# Patient Record
Sex: Male | Born: 1935 | Race: White | Hispanic: No | Smoking: Former smoker
Health system: Southern US, Community
[De-identification: ages and names within clinical notes are randomized; demographics above are authoritative.]

## PROBLEM LIST (undated history)

## (undated) DIAGNOSIS — J449 Chronic obstructive pulmonary disease, unspecified: Secondary | ICD-10-CM

## (undated) DIAGNOSIS — I10 Essential (primary) hypertension: Secondary | ICD-10-CM

## (undated) DIAGNOSIS — F419 Anxiety disorder, unspecified: Secondary | ICD-10-CM

## (undated) DIAGNOSIS — IMO0001 Reserved for inherently not codable concepts without codable children: Secondary | ICD-10-CM

## (undated) DIAGNOSIS — C3490 Malignant neoplasm of unspecified part of unspecified bronchus or lung: Secondary | ICD-10-CM

## (undated) DIAGNOSIS — K219 Gastro-esophageal reflux disease without esophagitis: Secondary | ICD-10-CM

## (undated) DIAGNOSIS — E7800 Pure hypercholesterolemia, unspecified: Secondary | ICD-10-CM

## (undated) HISTORY — DX: Chronic obstructive pulmonary disease, unspecified: J44.9

## (undated) HISTORY — DX: Anxiety disorder, unspecified: F41.9

## (undated) HISTORY — DX: Pure hypercholesterolemia, unspecified: E78.00

## (undated) HISTORY — DX: Gastro-esophageal reflux disease without esophagitis: K21.9

## (undated) HISTORY — DX: Malignant neoplasm of unspecified part of unspecified bronchus or lung: C34.90

## (undated) HISTORY — DX: Reserved for inherently not codable concepts without codable children: IMO0001

## (undated) HISTORY — DX: Essential (primary) hypertension: I10

---

## 1995-10-24 HISTORY — PX: OTHER SURGICAL HISTORY: SHX169

## 1998-05-12 ENCOUNTER — Ambulatory Visit (HOSPITAL_COMMUNITY): Admission: RE | Admit: 1998-05-12 | Discharge: 1998-05-12 | Payer: Self-pay | Admitting: Gastroenterology

## 1998-12-13 ENCOUNTER — Ambulatory Visit (HOSPITAL_COMMUNITY): Admission: RE | Admit: 1998-12-13 | Discharge: 1998-12-13 | Payer: Self-pay | Admitting: Family Medicine

## 1998-12-13 ENCOUNTER — Encounter: Payer: Self-pay | Admitting: Family Medicine

## 1999-10-19 ENCOUNTER — Ambulatory Visit (HOSPITAL_COMMUNITY): Admission: RE | Admit: 1999-10-19 | Discharge: 1999-10-19 | Payer: Self-pay | Admitting: Gastroenterology

## 2000-07-27 ENCOUNTER — Encounter: Payer: Self-pay | Admitting: Gastroenterology

## 2000-07-27 ENCOUNTER — Ambulatory Visit (HOSPITAL_COMMUNITY): Admission: RE | Admit: 2000-07-27 | Discharge: 2000-07-27 | Payer: Self-pay | Admitting: Gastroenterology

## 2000-10-23 HISTORY — PX: OTHER SURGICAL HISTORY: SHX169

## 2000-11-05 ENCOUNTER — Ambulatory Visit (HOSPITAL_COMMUNITY): Admission: RE | Admit: 2000-11-05 | Discharge: 2000-11-05 | Payer: Self-pay | Admitting: Gastroenterology

## 2000-11-05 ENCOUNTER — Encounter: Payer: Self-pay | Admitting: Gastroenterology

## 2001-05-16 ENCOUNTER — Encounter: Admission: RE | Admit: 2001-05-16 | Discharge: 2001-05-16 | Payer: Self-pay | Admitting: Family Medicine

## 2001-05-16 ENCOUNTER — Encounter: Payer: Self-pay | Admitting: Family Medicine

## 2001-08-22 ENCOUNTER — Other Ambulatory Visit: Admission: RE | Admit: 2001-08-22 | Discharge: 2001-08-22 | Payer: Self-pay | Admitting: Family Medicine

## 2001-10-25 ENCOUNTER — Encounter: Admission: RE | Admit: 2001-10-25 | Discharge: 2001-10-25 | Payer: Self-pay | Admitting: Family Medicine

## 2001-10-25 ENCOUNTER — Encounter: Payer: Self-pay | Admitting: Family Medicine

## 2001-10-30 ENCOUNTER — Inpatient Hospital Stay (HOSPITAL_COMMUNITY): Admission: EM | Admit: 2001-10-30 | Discharge: 2001-11-02 | Payer: Self-pay | Admitting: Emergency Medicine

## 2001-10-30 ENCOUNTER — Encounter: Payer: Self-pay | Admitting: Internal Medicine

## 2002-01-29 ENCOUNTER — Ambulatory Visit (HOSPITAL_COMMUNITY): Admission: RE | Admit: 2002-01-29 | Discharge: 2002-01-29 | Payer: Self-pay | Admitting: Pulmonary Disease

## 2002-01-29 ENCOUNTER — Encounter: Payer: Self-pay | Admitting: Pulmonary Disease

## 2002-02-26 ENCOUNTER — Encounter: Payer: Self-pay | Admitting: Pulmonary Disease

## 2002-02-26 ENCOUNTER — Ambulatory Visit (HOSPITAL_COMMUNITY): Admission: RE | Admit: 2002-02-26 | Discharge: 2002-02-26 | Payer: Self-pay | Admitting: Pulmonary Disease

## 2002-03-28 ENCOUNTER — Encounter: Admission: RE | Admit: 2002-03-28 | Discharge: 2002-03-28 | Payer: Self-pay | Admitting: Family Medicine

## 2002-03-28 ENCOUNTER — Encounter: Payer: Self-pay | Admitting: Family Medicine

## 2002-04-12 ENCOUNTER — Encounter: Payer: Self-pay | Admitting: Family Medicine

## 2002-04-12 ENCOUNTER — Encounter: Admission: RE | Admit: 2002-04-12 | Discharge: 2002-04-12 | Payer: Self-pay | Admitting: Family Medicine

## 2002-04-21 ENCOUNTER — Encounter: Admission: RE | Admit: 2002-04-21 | Discharge: 2002-05-26 | Payer: Self-pay | Admitting: Family Medicine

## 2002-05-29 ENCOUNTER — Ambulatory Visit (HOSPITAL_COMMUNITY): Admission: RE | Admit: 2002-05-29 | Discharge: 2002-05-29 | Payer: Self-pay | Admitting: Sports Medicine

## 2002-05-29 ENCOUNTER — Encounter: Payer: Self-pay | Admitting: Sports Medicine

## 2002-06-04 ENCOUNTER — Encounter: Payer: Self-pay | Admitting: Sports Medicine

## 2002-06-04 ENCOUNTER — Encounter: Admission: RE | Admit: 2002-06-04 | Discharge: 2002-06-04 | Payer: Self-pay | Admitting: Sports Medicine

## 2002-06-18 ENCOUNTER — Encounter: Admission: RE | Admit: 2002-06-18 | Discharge: 2002-06-18 | Payer: Self-pay | Admitting: Sports Medicine

## 2002-06-18 ENCOUNTER — Encounter: Payer: Self-pay | Admitting: Sports Medicine

## 2002-07-03 ENCOUNTER — Encounter: Admission: RE | Admit: 2002-07-03 | Discharge: 2002-07-03 | Payer: Self-pay | Admitting: Sports Medicine

## 2002-07-03 ENCOUNTER — Encounter: Payer: Self-pay | Admitting: Sports Medicine

## 2002-08-12 ENCOUNTER — Ambulatory Visit (HOSPITAL_COMMUNITY): Admission: RE | Admit: 2002-08-12 | Discharge: 2002-08-12 | Payer: Self-pay | Admitting: Gastroenterology

## 2002-08-12 ENCOUNTER — Encounter (INDEPENDENT_AMBULATORY_CARE_PROVIDER_SITE_OTHER): Payer: Self-pay | Admitting: Specialist

## 2002-12-22 ENCOUNTER — Encounter: Payer: Self-pay | Admitting: Sports Medicine

## 2002-12-22 ENCOUNTER — Encounter: Admission: RE | Admit: 2002-12-22 | Discharge: 2002-12-22 | Payer: Self-pay | Admitting: Sports Medicine

## 2002-12-25 ENCOUNTER — Encounter: Payer: Self-pay | Admitting: Sports Medicine

## 2002-12-25 ENCOUNTER — Encounter: Admission: RE | Admit: 2002-12-25 | Discharge: 2002-12-25 | Payer: Self-pay | Admitting: Sports Medicine

## 2003-01-08 ENCOUNTER — Encounter: Payer: Self-pay | Admitting: Sports Medicine

## 2003-01-08 ENCOUNTER — Encounter: Admission: RE | Admit: 2003-01-08 | Discharge: 2003-01-08 | Payer: Self-pay | Admitting: Sports Medicine

## 2003-10-29 ENCOUNTER — Encounter: Admission: RE | Admit: 2003-10-29 | Discharge: 2003-10-29 | Payer: Self-pay | Admitting: Sports Medicine

## 2003-11-11 ENCOUNTER — Encounter: Admission: RE | Admit: 2003-11-11 | Discharge: 2003-11-11 | Payer: Self-pay | Admitting: Sports Medicine

## 2004-01-27 ENCOUNTER — Encounter: Admission: RE | Admit: 2004-01-27 | Discharge: 2004-01-27 | Payer: Self-pay | Admitting: Internal Medicine

## 2004-03-08 ENCOUNTER — Encounter: Admission: RE | Admit: 2004-03-08 | Discharge: 2004-03-08 | Payer: Self-pay | Admitting: Internal Medicine

## 2004-08-17 ENCOUNTER — Encounter: Admission: RE | Admit: 2004-08-17 | Discharge: 2004-08-17 | Payer: Self-pay | Admitting: Otolaryngology

## 2004-09-12 ENCOUNTER — Ambulatory Visit: Payer: Self-pay | Admitting: Pulmonary Disease

## 2004-12-14 ENCOUNTER — Ambulatory Visit: Payer: Self-pay | Admitting: Pulmonary Disease

## 2005-01-18 HISTORY — PX: US ECHOCARDIOGRAPHY: HXRAD669

## 2005-03-10 ENCOUNTER — Ambulatory Visit: Payer: Self-pay | Admitting: Critical Care Medicine

## 2005-06-23 HISTORY — PX: CARDIOVASCULAR STRESS TEST: SHX262

## 2005-08-10 ENCOUNTER — Ambulatory Visit: Payer: Self-pay | Admitting: Internal Medicine

## 2005-08-24 ENCOUNTER — Encounter: Admission: RE | Admit: 2005-08-24 | Discharge: 2005-08-24 | Payer: Self-pay | Admitting: Internal Medicine

## 2005-10-21 IMAGING — CT CT CHEST W/ CM
1 series · 16 of 31 positions shown, 20 images · IV contrast (75ML OMNI 300)
Comparison: none

CLINICAL DATA: Weight loss.  History of lung cancer; status-post partial lung resection in 5000.  Evaluate for mass. 
 CT OF THE CHEST WITH CONTRAST 
 Multidetector helical CT imaging was performed during the IV bolus injection of 75 cc Omnipaque 300.  Correlation is made with a prior study done at [REDACTED] on 01/29/02.
 The patient is status-post bilateral thoracotomy with apparent upper lobe resection bilaterally.  There is stable scarring at the right apex, both lung bases, and within the lingula.  No lung mass or endobronchial lesion is demonstrated.  There is no evidence of mediastinal or hilar lymphadenopathy.  There is no pleural or pericardial effusion.  Images through the upper abdomen demonstrate no adrenal mass or change in the low attenuation lesion in the left hepatic lobe.  There is a probable sebaceous cyst posterior to the L-1 transverse process which was present previously. 
 IMPRESSION
 Stable post-operative CT of the chest.  No evidence of local recurrence or metastatic disease.

[Series 2: — · axial · 0.66mm/px · z∈[-274,+26]mm · 16 of 66 slices shown, 20 images]
[im 3/66  mediastinal]
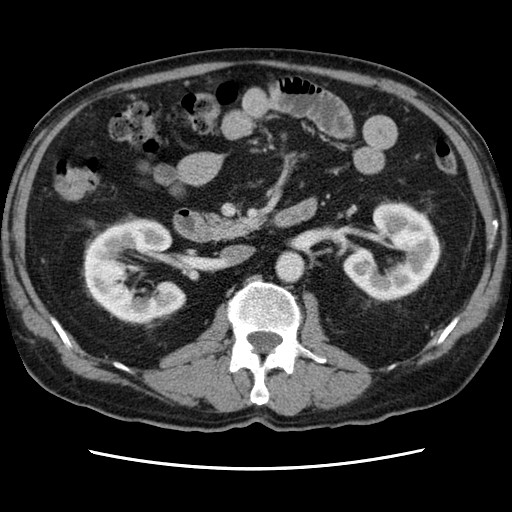
[im 3/66  lung]
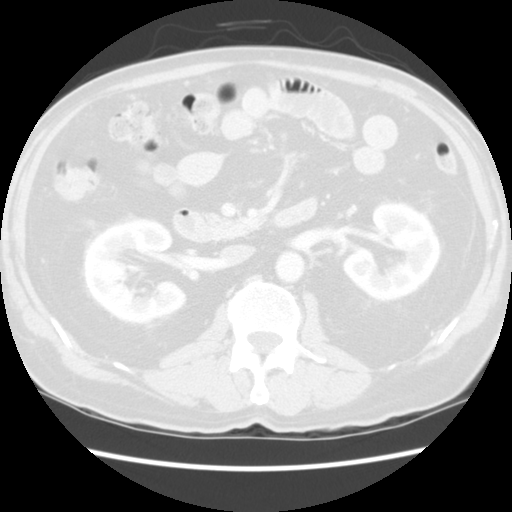
[im 8/66  lung]
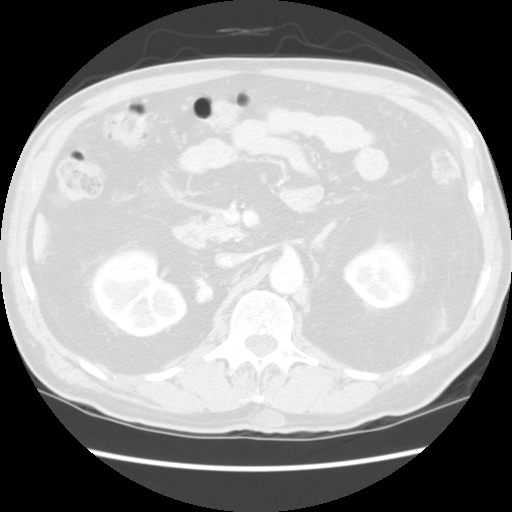
[im 13/66  lung]
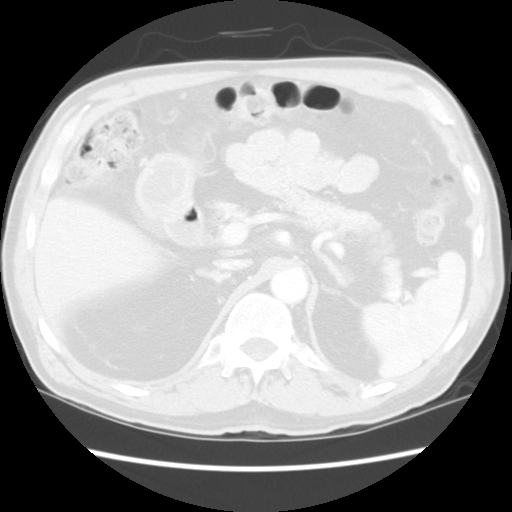
[im 15/66  lung]
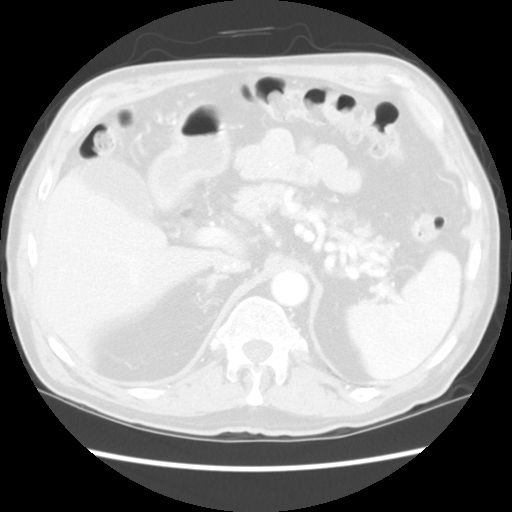
[im 20/66  mediastinal]
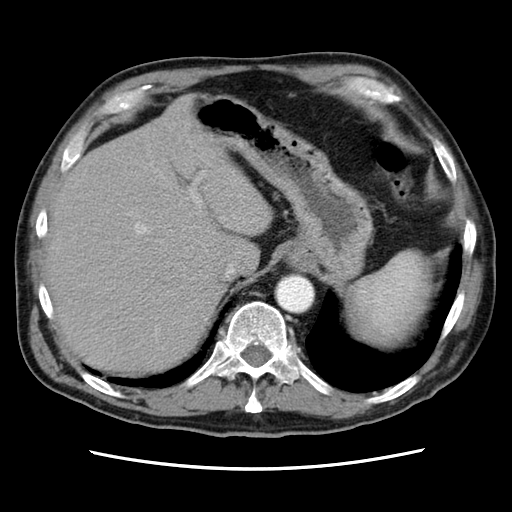
[im 20/66  lung]
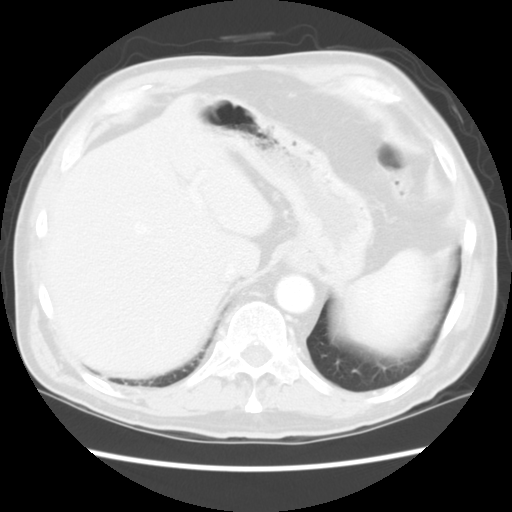
[im 22/66  lung]
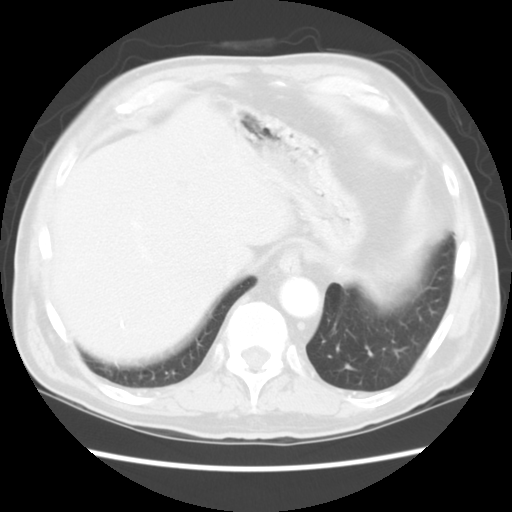
[im 27/66  lung]
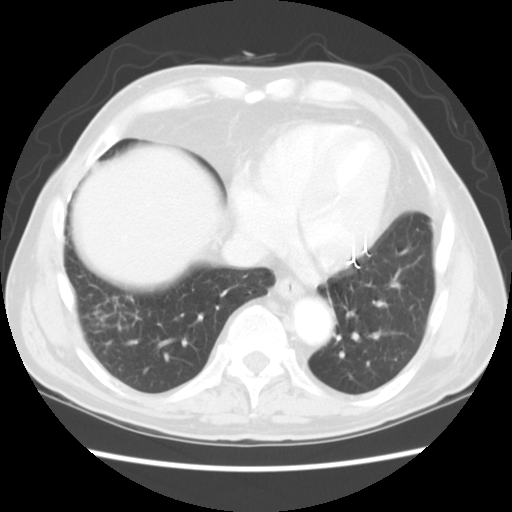
[im 32/66  lung]
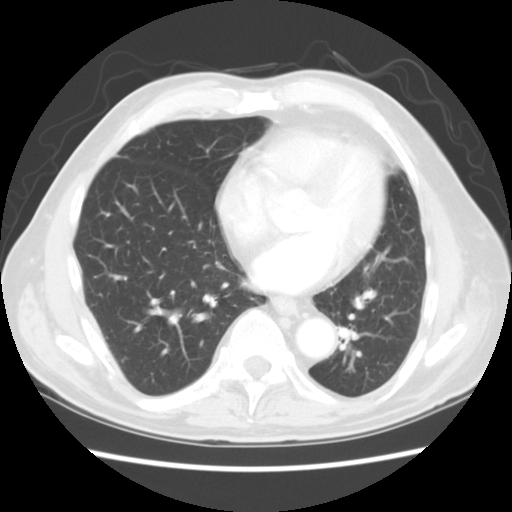
[im 35/66  mediastinal]
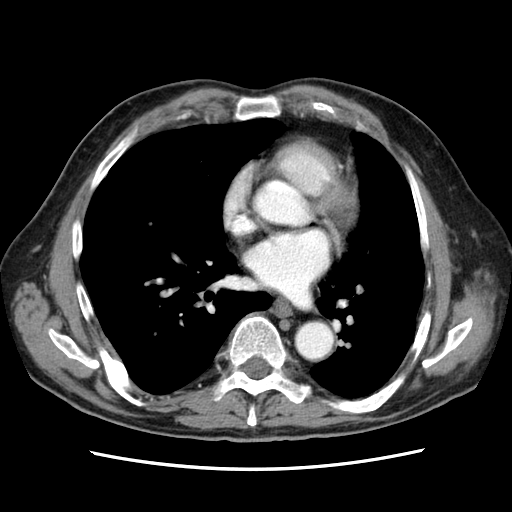
[im 35/66  lung]
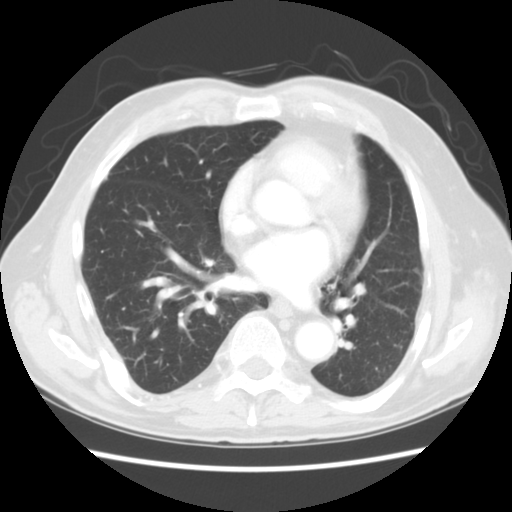
[im 39/66  lung]
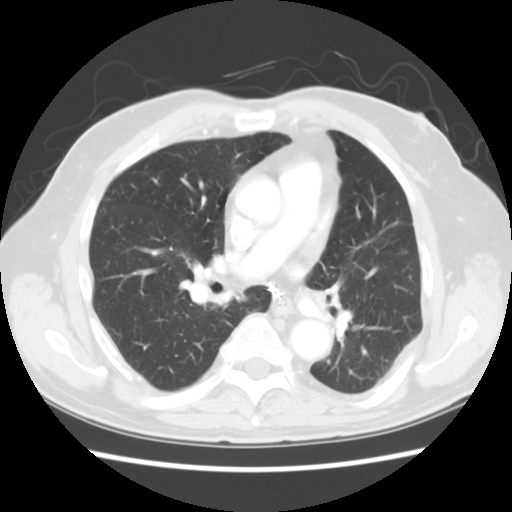
[im 41/66  lung]
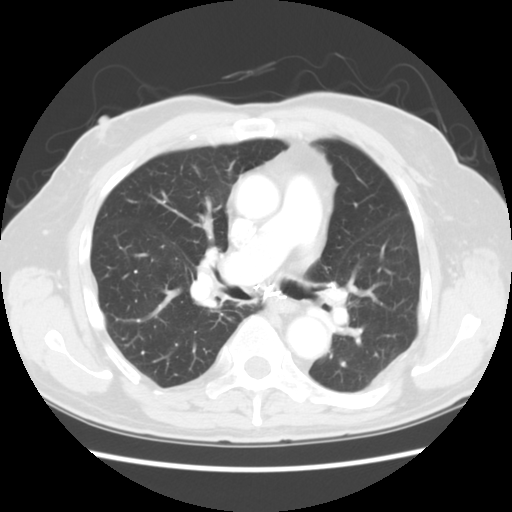
[im 44/66  lung]
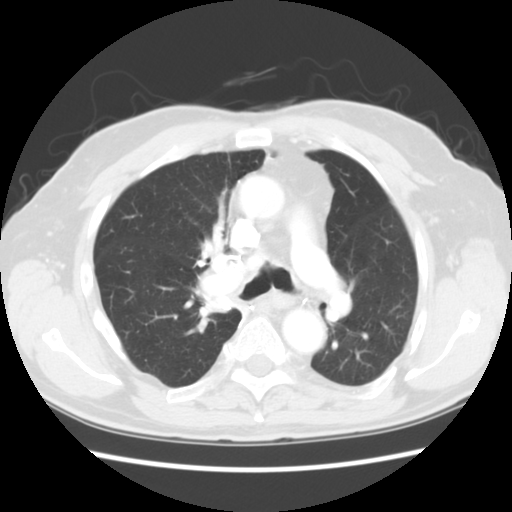
[im 49/66  mediastinal]
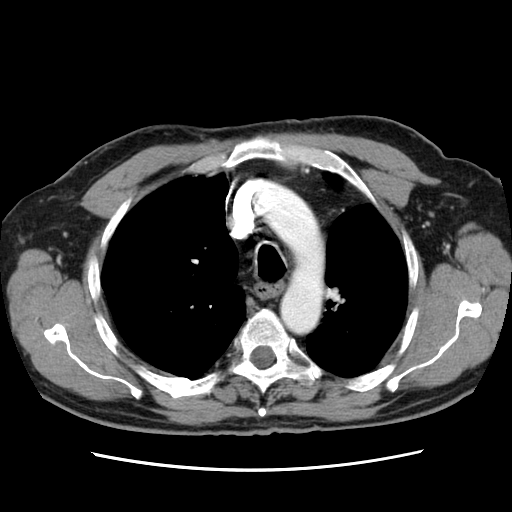
[im 49/66  lung]
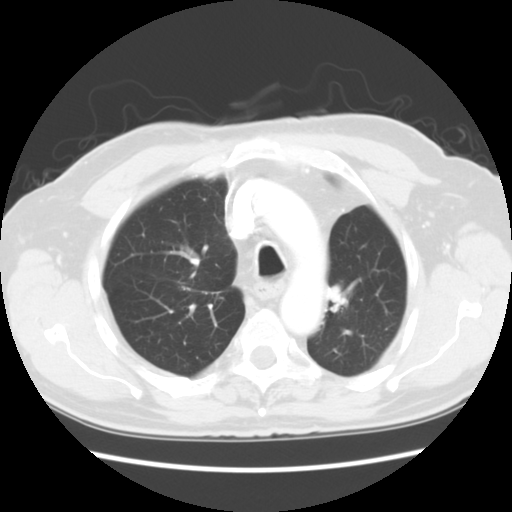
[im 53/66  lung]
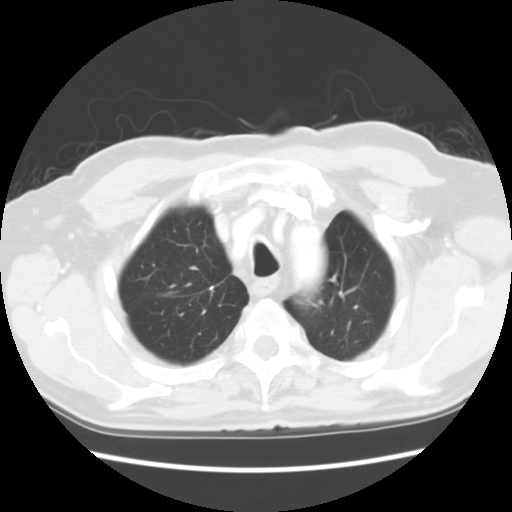
[im 58/66  lung]
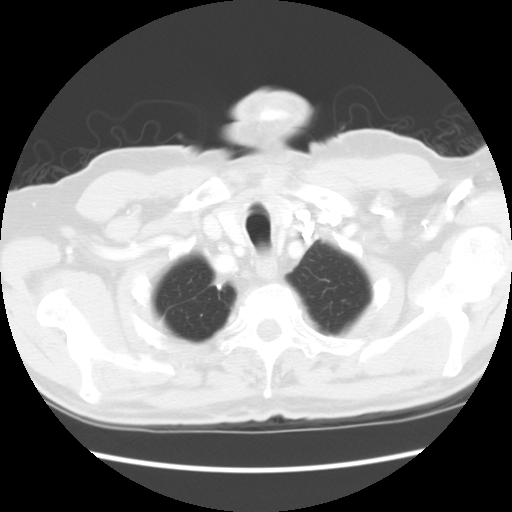
[im 63/66  lung]
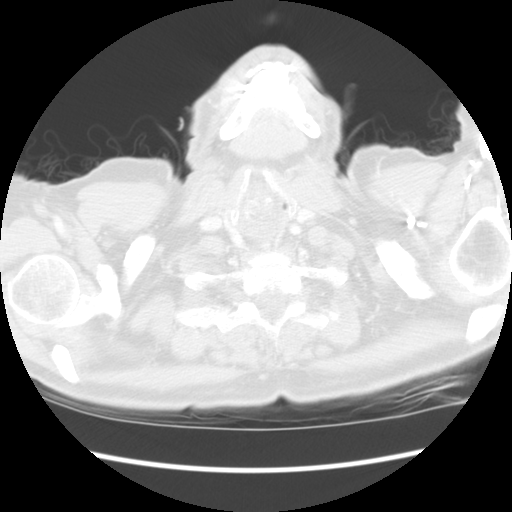

[16 of 31 positions shown; findings below may reference images not displayed]

## 2005-12-15 ENCOUNTER — Ambulatory Visit: Payer: Self-pay | Admitting: Pulmonary Disease

## 2006-02-05 ENCOUNTER — Ambulatory Visit: Payer: Self-pay | Admitting: Pulmonary Disease

## 2006-03-16 ENCOUNTER — Ambulatory Visit: Payer: Self-pay | Admitting: Pulmonary Disease

## 2006-03-29 ENCOUNTER — Ambulatory Visit: Payer: Self-pay | Admitting: Pulmonary Disease

## 2006-04-30 ENCOUNTER — Ambulatory Visit: Payer: Self-pay | Admitting: Pulmonary Disease

## 2006-06-06 ENCOUNTER — Encounter: Admission: RE | Admit: 2006-06-06 | Discharge: 2006-06-06 | Payer: Self-pay | Admitting: Internal Medicine

## 2006-06-15 ENCOUNTER — Encounter: Admission: RE | Admit: 2006-06-15 | Discharge: 2006-06-15 | Payer: Self-pay | Admitting: Cardiology

## 2006-08-29 ENCOUNTER — Encounter: Admission: RE | Admit: 2006-08-29 | Discharge: 2006-08-29 | Payer: Self-pay | Admitting: Sports Medicine

## 2006-09-21 ENCOUNTER — Encounter: Admission: RE | Admit: 2006-09-21 | Discharge: 2006-09-21 | Payer: Self-pay | Admitting: Sports Medicine

## 2006-10-01 ENCOUNTER — Encounter: Admission: RE | Admit: 2006-10-01 | Discharge: 2006-10-01 | Payer: Self-pay | Admitting: Sports Medicine

## 2007-01-07 ENCOUNTER — Ambulatory Visit: Payer: Self-pay | Admitting: Pulmonary Disease

## 2007-01-21 ENCOUNTER — Ambulatory Visit: Payer: Self-pay | Admitting: Internal Medicine

## 2007-02-21 ENCOUNTER — Ambulatory Visit: Payer: Self-pay | Admitting: Pulmonary Disease

## 2007-03-07 ENCOUNTER — Ambulatory Visit: Payer: Self-pay | Admitting: Pulmonary Disease

## 2007-04-04 ENCOUNTER — Encounter: Admission: RE | Admit: 2007-04-04 | Discharge: 2007-04-04 | Payer: Self-pay | Admitting: Sports Medicine

## 2007-04-11 ENCOUNTER — Ambulatory Visit: Payer: Self-pay | Admitting: Internal Medicine

## 2007-04-18 ENCOUNTER — Encounter: Admission: RE | Admit: 2007-04-18 | Discharge: 2007-04-18 | Payer: Self-pay | Admitting: Sports Medicine

## 2007-04-30 ENCOUNTER — Ambulatory Visit: Payer: Self-pay | Admitting: Internal Medicine

## 2007-05-14 ENCOUNTER — Encounter: Admission: RE | Admit: 2007-05-14 | Discharge: 2007-05-14 | Payer: Self-pay | Admitting: Sports Medicine

## 2007-06-10 ENCOUNTER — Encounter: Admission: RE | Admit: 2007-06-10 | Discharge: 2007-06-10 | Payer: Self-pay | Admitting: Internal Medicine

## 2007-06-21 ENCOUNTER — Ambulatory Visit: Payer: Self-pay | Admitting: Internal Medicine

## 2007-08-05 ENCOUNTER — Ambulatory Visit: Payer: Self-pay | Admitting: Internal Medicine

## 2007-08-23 ENCOUNTER — Encounter: Payer: Self-pay | Admitting: Internal Medicine

## 2007-09-02 ENCOUNTER — Ambulatory Visit: Payer: Self-pay | Admitting: Internal Medicine

## 2007-10-14 ENCOUNTER — Encounter: Admission: RE | Admit: 2007-10-14 | Discharge: 2007-10-14 | Payer: Self-pay | Admitting: Family Medicine

## 2007-10-24 DIAGNOSIS — IMO0001 Reserved for inherently not codable concepts without codable children: Secondary | ICD-10-CM

## 2007-10-24 HISTORY — DX: Reserved for inherently not codable concepts without codable children: IMO0001

## 2007-11-11 ENCOUNTER — Telehealth (INDEPENDENT_AMBULATORY_CARE_PROVIDER_SITE_OTHER): Payer: Self-pay | Admitting: *Deleted

## 2007-11-12 ENCOUNTER — Ambulatory Visit: Payer: Self-pay | Admitting: Pulmonary Disease

## 2007-11-12 DIAGNOSIS — J209 Acute bronchitis, unspecified: Secondary | ICD-10-CM

## 2007-11-12 DIAGNOSIS — I10 Essential (primary) hypertension: Secondary | ICD-10-CM

## 2007-11-12 DIAGNOSIS — J45909 Unspecified asthma, uncomplicated: Secondary | ICD-10-CM | POA: Insufficient documentation

## 2007-11-12 DIAGNOSIS — K219 Gastro-esophageal reflux disease without esophagitis: Secondary | ICD-10-CM | POA: Insufficient documentation

## 2007-11-12 DIAGNOSIS — E7800 Pure hypercholesterolemia, unspecified: Secondary | ICD-10-CM

## 2007-11-12 DIAGNOSIS — J439 Emphysema, unspecified: Secondary | ICD-10-CM | POA: Insufficient documentation

## 2007-11-12 DIAGNOSIS — C3490 Malignant neoplasm of unspecified part of unspecified bronchus or lung: Secondary | ICD-10-CM | POA: Insufficient documentation

## 2007-11-12 DIAGNOSIS — R059 Cough, unspecified: Secondary | ICD-10-CM | POA: Insufficient documentation

## 2007-11-12 DIAGNOSIS — R05 Cough: Secondary | ICD-10-CM

## 2007-11-14 ENCOUNTER — Ambulatory Visit: Payer: Self-pay | Admitting: Internal Medicine

## 2007-11-24 HISTORY — PX: US ECHOCARDIOGRAPHY: HXRAD669

## 2007-11-25 ENCOUNTER — Ambulatory Visit: Payer: Self-pay | Admitting: Internal Medicine

## 2007-12-13 ENCOUNTER — Ambulatory Visit: Payer: Self-pay | Admitting: Internal Medicine

## 2007-12-16 ENCOUNTER — Encounter: Payer: Self-pay | Admitting: Cardiology

## 2007-12-30 ENCOUNTER — Encounter: Admission: RE | Admit: 2007-12-30 | Discharge: 2007-12-30 | Payer: Self-pay | Admitting: Family Medicine

## 2008-01-13 ENCOUNTER — Encounter: Admission: RE | Admit: 2008-01-13 | Discharge: 2008-01-13 | Payer: Self-pay | Admitting: Family Medicine

## 2008-02-07 ENCOUNTER — Telehealth (INDEPENDENT_AMBULATORY_CARE_PROVIDER_SITE_OTHER): Payer: Self-pay | Admitting: *Deleted

## 2008-02-13 ENCOUNTER — Telehealth: Payer: Self-pay | Admitting: Adult Health

## 2008-03-10 ENCOUNTER — Telehealth (INDEPENDENT_AMBULATORY_CARE_PROVIDER_SITE_OTHER): Payer: Self-pay | Admitting: *Deleted

## 2008-05-04 ENCOUNTER — Encounter: Admission: RE | Admit: 2008-05-04 | Discharge: 2008-05-04 | Payer: Self-pay | Admitting: Specialist

## 2008-05-06 ENCOUNTER — Encounter (HOSPITAL_COMMUNITY): Admission: RE | Admit: 2008-05-06 | Discharge: 2008-08-04 | Payer: Self-pay | Admitting: Internal Medicine

## 2008-07-17 ENCOUNTER — Telehealth (INDEPENDENT_AMBULATORY_CARE_PROVIDER_SITE_OTHER): Payer: Self-pay | Admitting: *Deleted

## 2008-07-17 ENCOUNTER — Ambulatory Visit: Payer: Self-pay | Admitting: Internal Medicine

## 2008-07-24 ENCOUNTER — Ambulatory Visit: Payer: Self-pay | Admitting: Internal Medicine

## 2008-07-31 ENCOUNTER — Telehealth (INDEPENDENT_AMBULATORY_CARE_PROVIDER_SITE_OTHER): Payer: Self-pay | Admitting: *Deleted

## 2008-08-14 ENCOUNTER — Encounter: Payer: Self-pay | Admitting: Internal Medicine

## 2008-09-21 ENCOUNTER — Ambulatory Visit: Payer: Self-pay | Admitting: Internal Medicine

## 2008-09-29 ENCOUNTER — Telehealth (INDEPENDENT_AMBULATORY_CARE_PROVIDER_SITE_OTHER): Payer: Self-pay | Admitting: *Deleted

## 2008-12-12 ENCOUNTER — Emergency Department (HOSPITAL_COMMUNITY): Admission: EM | Admit: 2008-12-12 | Discharge: 2008-12-12 | Payer: Self-pay | Admitting: Emergency Medicine

## 2009-01-03 ENCOUNTER — Emergency Department (HOSPITAL_COMMUNITY): Admission: EM | Admit: 2009-01-03 | Discharge: 2009-01-03 | Payer: Self-pay | Admitting: Family Medicine

## 2009-01-18 ENCOUNTER — Ambulatory Visit: Payer: Self-pay | Admitting: Internal Medicine

## 2009-03-04 IMAGING — CR DG CHEST 2V
2 series · 2 of 2 positions shown · non-contrast
Comparison: 02/21/07.

CLINICAL DATA: Left-sided chest pain.
 CHEST ? 2 VIEW:

[view not recorded (1 of 2)]
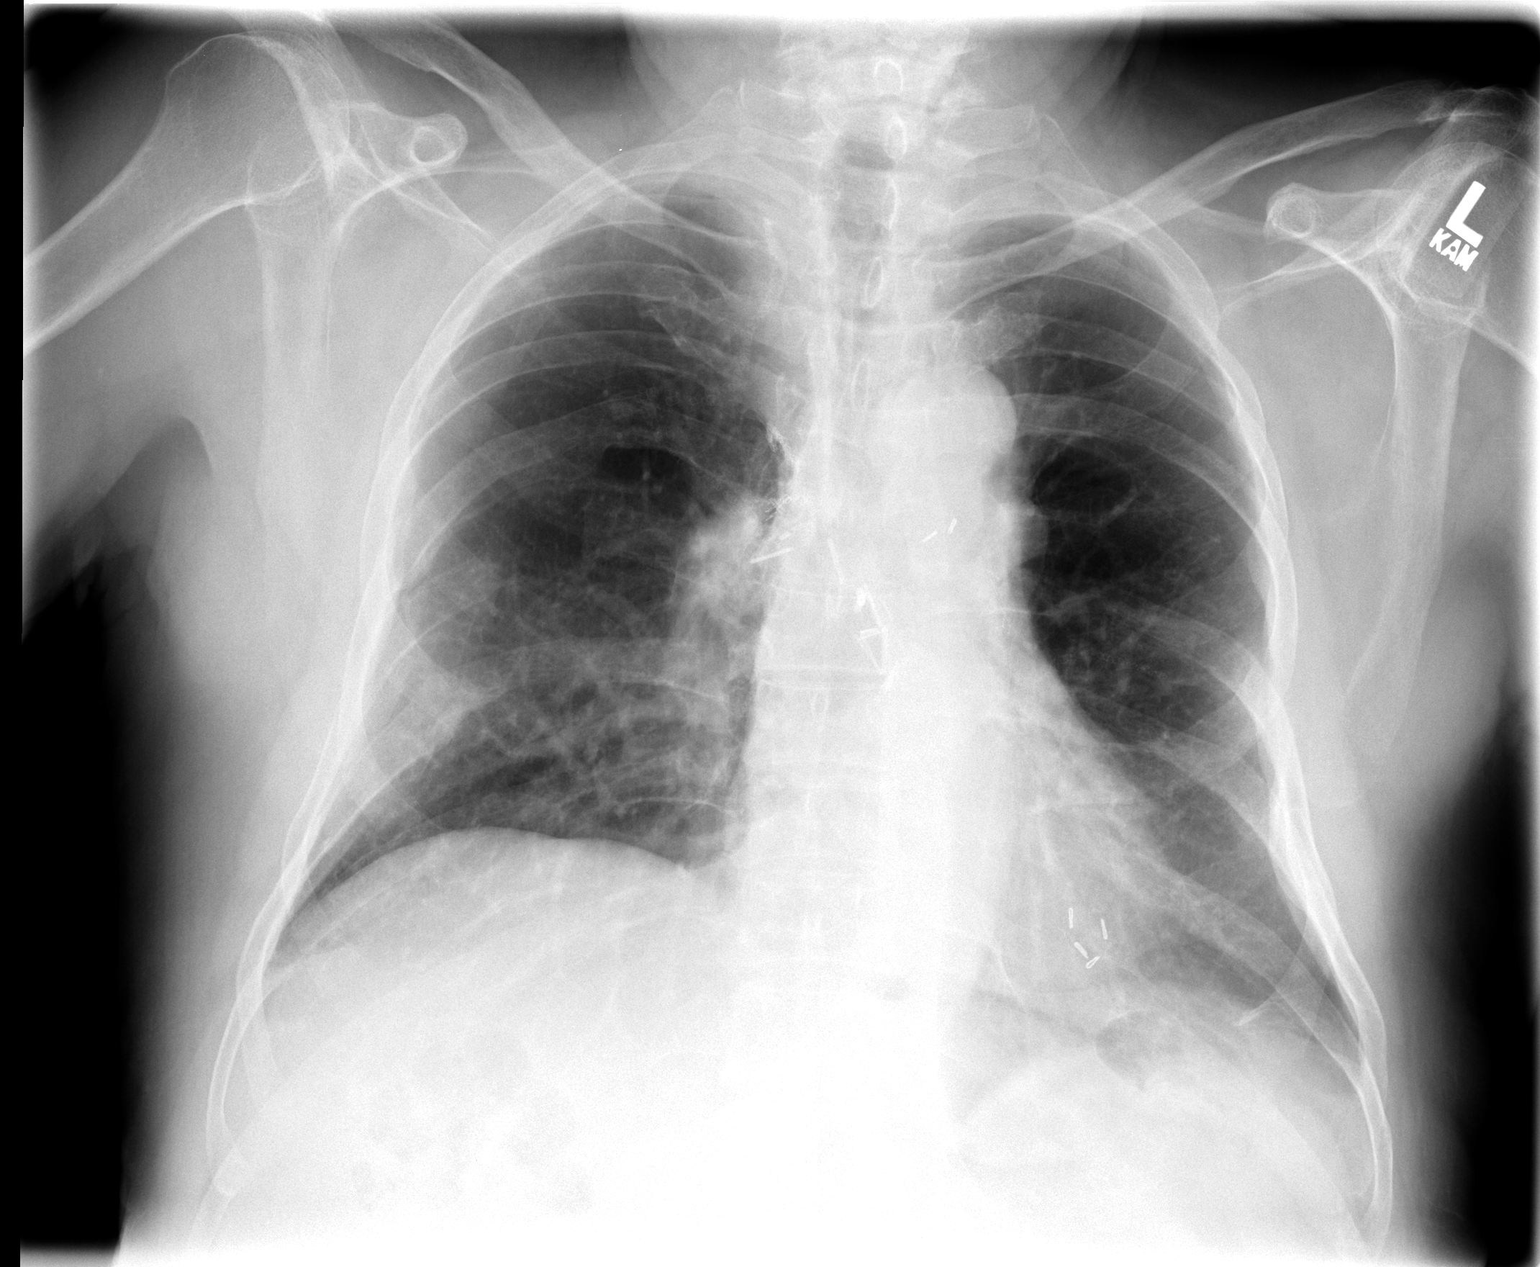

[view not recorded (2 of 2)]
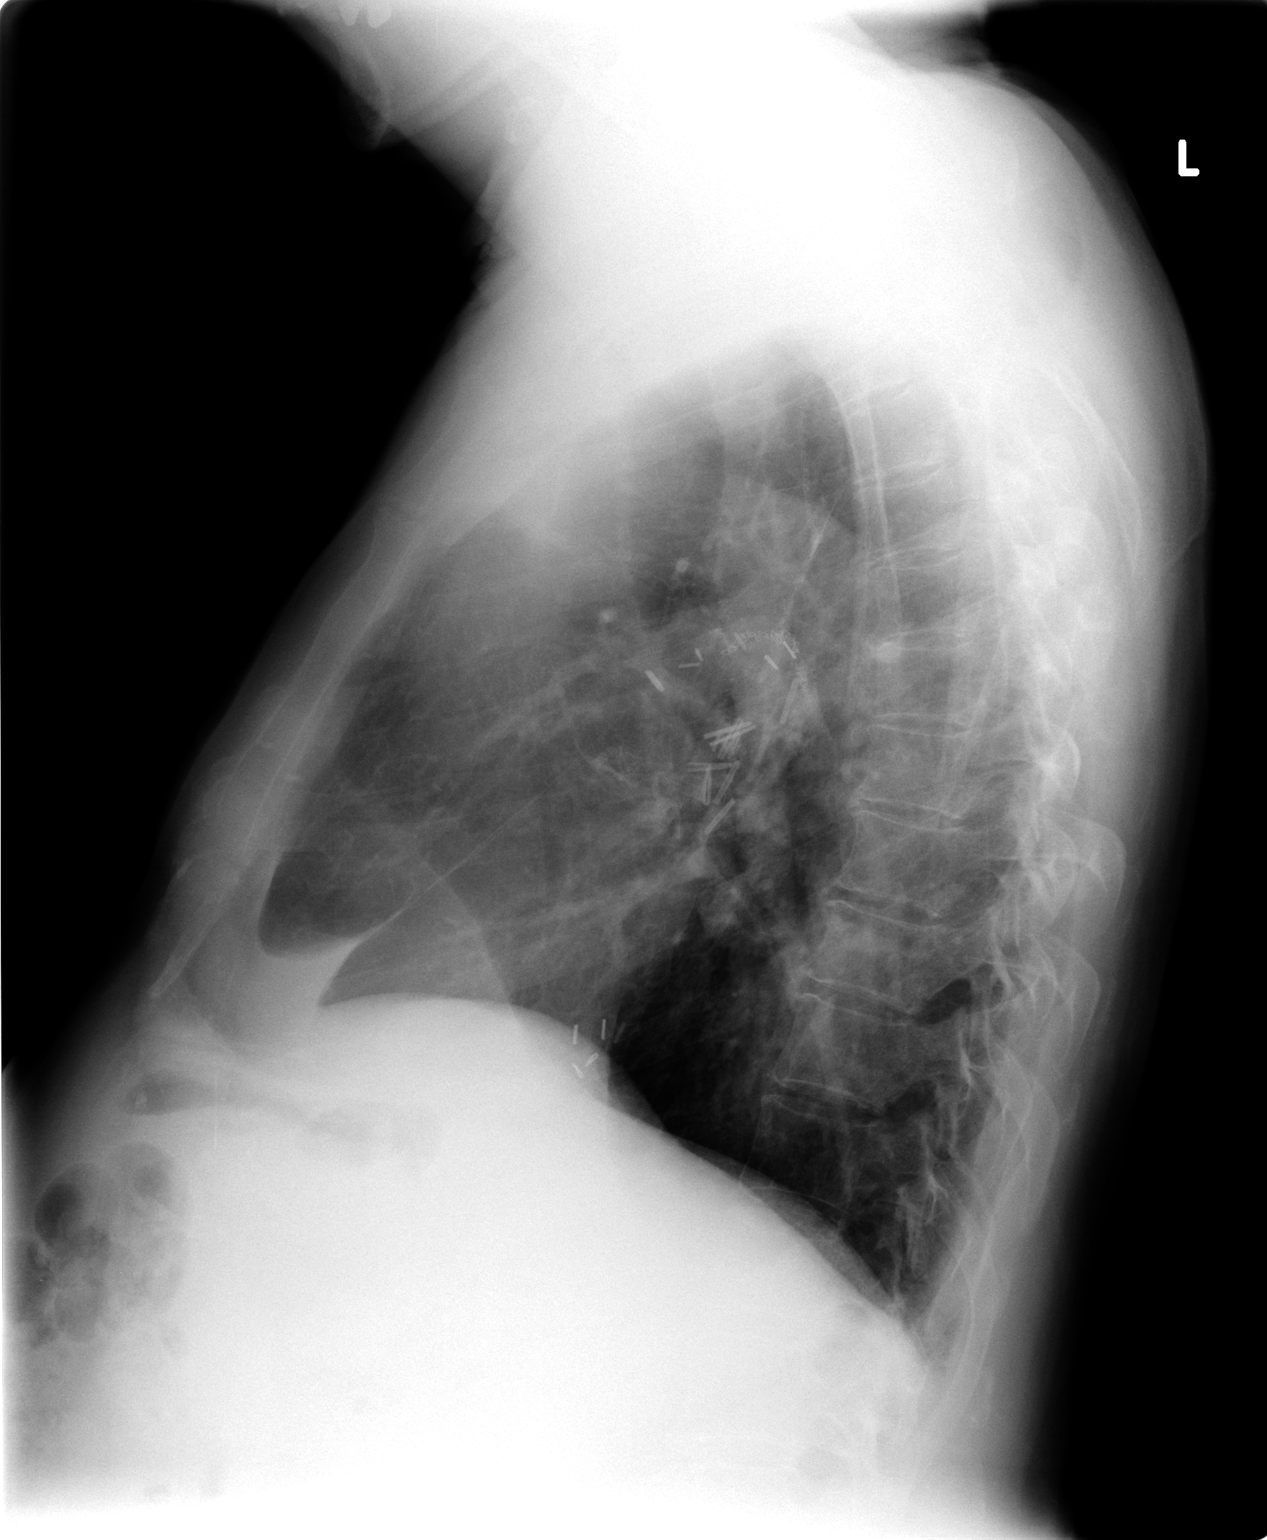

[2 of 2 positions shown; findings below may reference images not displayed]

FINDINGS: Postsurgical changes bilaterally are re-demonstrated.  The heart is normal.  Basilar scarring is stable.  Increased density right hilum attributed to postsurgical change.
IMPRESSION: Stable chest.

## 2009-08-06 ENCOUNTER — Ambulatory Visit: Payer: Self-pay | Admitting: Internal Medicine

## 2009-11-04 ENCOUNTER — Ambulatory Visit: Payer: Self-pay | Admitting: Internal Medicine

## 2009-12-08 ENCOUNTER — Telehealth: Payer: Self-pay | Admitting: Internal Medicine

## 2009-12-27 ENCOUNTER — Ambulatory Visit: Payer: Self-pay | Admitting: Vascular Surgery

## 2010-01-17 ENCOUNTER — Ambulatory Visit: Payer: Self-pay | Admitting: Internal Medicine

## 2010-01-19 ENCOUNTER — Telehealth (INDEPENDENT_AMBULATORY_CARE_PROVIDER_SITE_OTHER): Payer: Self-pay | Admitting: *Deleted

## 2010-01-27 ENCOUNTER — Encounter: Admission: RE | Admit: 2010-01-27 | Discharge: 2010-01-27 | Payer: Self-pay | Admitting: Sports Medicine

## 2010-05-16 ENCOUNTER — Ambulatory Visit: Payer: Self-pay | Admitting: Internal Medicine

## 2010-07-19 ENCOUNTER — Ambulatory Visit: Payer: Self-pay | Admitting: Cardiology

## 2010-08-18 ENCOUNTER — Ambulatory Visit: Payer: Self-pay | Admitting: Cardiology

## 2010-08-23 ENCOUNTER — Telehealth (INDEPENDENT_AMBULATORY_CARE_PROVIDER_SITE_OTHER): Payer: Self-pay | Admitting: *Deleted

## 2010-11-13 ENCOUNTER — Encounter: Payer: Self-pay | Admitting: Sports Medicine

## 2010-11-22 NOTE — Progress Notes (Signed)
Summary: results  Phone Note Call from Patient   Caller: Patient Call For: young Summary of Call: pt calling for xray results Initial call taken by: Rickard Patience,  January 19, 2010 3:20 PM  Follow-up for Phone Call        called and spoke with pt.  pt aware of cxr results.  Arman Filter LPN  January 19, 2010 3:38 PM

## 2010-11-22 NOTE — Assessment & Plan Note (Signed)
Summary: f/u 1 yr////kp   Primary Provider/Referring Provider:  [facility]  CC:  Follow up visit.  History of Present Illness:  01/18/09- COPD/ asthma/ bronchitis, hx lung cancer Got both flu shots. Had no special problems and no significant infection this winter. Uses nebulizer machine only occasionally- once per month. Has had some increase chest congestion over last few weeks No obvious recent cold and doesn't think seasonal pollens are bothering him. Sputum was brown -yellow 2-3 weeks but now just clear mucus. Dyspnea is stable- noted with sustained walking. He is satisfied to make no change. He follows at [facility] and they have schedlued an "oxygen test".   August 06, 2009- COPD,asthma, bronchits, hx lung cancer/ bilateral lobectomies Chronic cough has dried up in last 2 days with cooler weather. Was white- no green or blood, Denies fever, chest pain, sorethroat. Notes BP up, no weight loss, pain or fever.  November 04, 2009- COPD, asthma, bronchitis, hx lung ca/ bilateral lobectomies Still sees Dr Susa Simmonds at [facility] once yearly for f/u lung ca. Was very clear last week with him, then 5 days later with no exposure got  very congested without fever, green or sore throat. Now at baseline, denying productive cough. Admitting ankle edema. Elevates HOB, little nocturia.  January 17, 2010- COPD, asthma, bronchitis, hx lung cancer/ bilateral lobectomies Got through winter ok. Productive cough is variable with white, nonbloody phlegm. Dyspnea with exertion seems to slowly porogress. Had scheduled f/u at [facility] for his hx of lung cancer and they asked he remember to get f/u CXR here. Dr Deborah Chalk asked he continue diuretic script that  we wrote.    Current Medications (verified): 1)  Combivent 103-18 Mcg/act  Aero (Ipratropium-Albuterol) .... 2 Puffs Four Times A Day 2)  Bayer Aspirin Ec Low Dose 81 Mg  Tbec (Aspirin) .... Take 1 Tablet By Mouth Once A Day 3)  Albuterol Sulfate (2.5 Mg/82ml) 0.083%  Nebu (Albuterol  Sulfate) .Marland Kitchen.. 1 Every 4 Hours As Needed 4)  Amlodipine Besylate 5 Mg Tabs (Amlodipine Besylate) .... Take 2 By Mouth Two Times A Day 5)  Omeprazole 20 Mg Cpdr (Omeprazole) .... Take 1 By Mouth Once Daily 6)  Maxzide-25 37.5-25 Mg Tabs (Triamterene-Hctz) .Marland Kitchen.. 1 Daily As Needed Diuretic  Allergies (verified): No Known Drug Allergies  Past History:  Past Medical History: Last updated: 12/13/2007 COUGH (ICD-786.2) Hx of CARCINOMA, LUNG (ICD-162.9) BRONCHITIS, ACUTE (ICD-466.0) GERD (ICD-530.81) HYPERCHOLESTEROLEMIA (ICD-272.0) HYPERTENSION, BENIGN (ICD-401.1) ASTHMA (ICD-493.90) COPD (ICD-496)  Past Surgical History: Last updated: 01/18/2009 Left lower lobectomy for cancer 1997  RULL lung nodule removed for cancer 2002  Family History: Last updated: 09/21/2008 negative for respiratory disease Heart Disease- Father Bladder CA- Mother  Social History: Last updated: 09/21/2008 Patient states former smoker. Quit in 1980.  Smoked 2 ppd x 20 years. No ETOH Widowed  Children Son lives with pt. Retired  Risk Factors: Smoking Status: quit (11/14/2007)  Review of Systems      See HPI       The patient complains of dyspnea on exertion and prolonged cough.  The patient denies anorexia, fever, weight loss, weight gain, vision loss, decreased hearing, hoarseness, chest pain, syncope, peripheral edema, headaches, hemoptysis, abdominal pain, and severe indigestion/heartburn.    Vital Signs:  Patient profile:   77 year old male Height:      66 inches Weight:      167 pounds BMI:     27.05 O2 Sat:      94 % on Room air Pulse rate:  85 / minute BP sitting:   150 / 80  (right arm) Cuff size:   regular  Vitals Entered By: Reynaldo Minium CMA (January 17, 2010 10:14 AM)  O2 Flow:  Room air  Physical Exam  Additional Exam:  General: A/Ox3; pleasant and cooperative, NAD, SKIN: no rash, lesions NODES: no lymphadenopathy HEENT: [place]/AT, EOM- WNL, Conjuctivae- clear, PERRLA, TM-WNL,  Nose- clear, Throat- clear and wnl, Mallampati  III NECK: Supple w/ fair ROM, JVD- trace, normal carotid impulses w/o bruits Thyroid-  CHEST: Unlabored slow wheeze., raspy without rales or dullness HEART: RRR, no m/g/r heard ABDOMEN: mild potbelly OVF:IEPP, nl pulses, sock line edema  NEURO: Grossly intact to observation      Impression & Recommendations:  Problem # 1:  COPD (ICD-496) Chronic bronchitis is variable but not much changed over time.  Problem # 2:  Hx of CARCINOMA, LUNG (ICD-162.9) No recurrence based on clinical f/u at [facility].  Other Orders: Est. Patient Level III (29518) T-2 View CXR (71020TC)  Patient Instructions: 1)  Please schedule a follow-up appointment in 4 months. 2)  A chest x-ray has been recommended.  Your imaging study may require preauthorization.  3)  Refilled script for your diuretic Prescriptions: MAXZIDE-25 37.5-25 MG TABS (TRIAMTERENE-HCTZ) 1 daily as needed diuretic  #30 x prn   Entered and Authorized by:   Waymon Budge MD   Signed by:   Waymon Budge MD on 01/17/2010   Method used:   Print then Give to Patient   RxID:   [id number]

## 2010-11-22 NOTE — Progress Notes (Signed)
Summary: speak to CDY nurse  Phone Note From Other Clinic Call back at [phone]   Caller: [facility], [place] Call For: Susannah Carbin Summary of Call: pt wants to go to Pulmonary Rehab, [facility] said they will send him, but they pft's & what stage he is in with his illness. Initial call taken by: Eugene Gavia,  December 08, 2009 3:00 PM  Follow-up for Phone Call        please advise if it is opkay with you for [facility] to refer pt to [facility], and if so what stage disease does teh pt have? Thanks. Carron Curie CMA  December 08, 2009 3:04 PM   Additional Follow-up for Phone Call Additional follow up Details #1::        OK for [facility] Rehab Gold Class III OK to send PFT report from 2008 Additional Follow-up by: Waymon Budge MD,  December 08, 2009 3:28 PM    Additional Follow-up for Phone Call Additional follow up Details #2::    I have faxed this phone note as well as PFT to [facility] at [phone]. Carron Curie CMA  December 08, 2009 3:35 PM

## 2010-11-22 NOTE — Assessment & Plan Note (Signed)
Summary: rov 3 months///kp   Primary Provider/Referring Provider:  [facility]  CC:  3 month follow up visit-past few days; Increased SOB, wheezing, and cough-white.Marland Kitchen  History of Present Illness:  CC:  Accute Visit-congestion-tight pass two days.Marland Kitchen  History of Present Illness: 07/24/08- Presents for 2 weeks of persistent cough, congestion, wheezing and increased DOE. Denies chest pain,  orthopnea, hemoptysis, fever, n/v/d, edema. Seen 10 days ago , given rx for doxycycline. No better.  09/21/08- COPD Returning fo f/u. CXR 07/17/08 - copd, NAD after thoracentesis. Chest usually feels clear. Now 4 days productive cough white sputum, no blood. Some days more congested, no pattern. Usually treats self otc but today thinks antibiotic woud help. Had flu vax. Denies pain, palpitation, fever, GI or GU changes.  01/18/09- COPD/ asthma/ bronchitis, hx lung cancer Got both flu shots. Had no special problems and no significant infection this winter. Uses nebulizer machine only occasionally- once per month. Has had some increase chest congestion over last few weeks No obvious recent cold and doesn't think seasonal pollens are bothering him. Sputum was brown -yellow 2-3 weeks but now just clear mucus. Dyspnea is stable- noted with sustained walking. He is satisfied to make no change. He follows at [facility] and they have schedlued an "oxygen test".   August 06, 2009- COPD,asthma, bronchits, hx lung cancer/ bilateral lobectomies Chronic cough has dried up in last 2 days with cooler weather. Was white- no green or blood, Denies fever, chest pain, sorethroat. Notes BP up, no weight loss, pain or fever.  November 04, 2009- COPD, asthma, bronchitis, hx lung ca/ bilateral lobectomies Still sees Dr Susa Simmonds at [facility] once yearly for f/u lung ca. Was very clear last week with him, then 5 days later with no exposure got  very congested without fever, green or sore throat. Now at baseline, denying productive cough. Admitting ankle edema.  Elevates HOB, little nocturia.   Current Medications (verified): 1)  Combivent 103-18 Mcg/act  Aero (Ipratropium-Albuterol) .... 2 Puffs Four Times A Day 2)  Bayer Aspirin Ec Low Dose 81 Mg  Tbec (Aspirin) .... Take 1 Tablet By Mouth Once A Day 3)  Albuterol Sulfate (2.5 Mg/42ml) 0.083%  Nebu (Albuterol Sulfate) .Marland Kitchen.. 1 Every 4 Hours As Needed 4)  Amlodipine Besylate 5 Mg Tabs (Amlodipine Besylate) .... Take 2 By Mouth Two Times A Day 5)  Omeprazole 20 Mg Cpdr (Omeprazole) .... Take 1 By Mouth Once Daily  Allergies (verified): No Known Drug Allergies  Past History:  Past Medical History: Last updated: 12/13/2007 COUGH (ICD-786.2) Hx of CARCINOMA, LUNG (ICD-162.9) BRONCHITIS, ACUTE (ICD-466.0) GERD (ICD-530.81) HYPERCHOLESTEROLEMIA (ICD-272.0) HYPERTENSION, BENIGN (ICD-401.1) ASTHMA (ICD-493.90) COPD (ICD-496)  Past Surgical History: Last updated: 01/18/2009 Left lower lobectomy for cancer 1997  RULL lung nodule removed for cancer 2002  Family History: Last updated: 09/21/2008 negative for respiratory disease Heart Disease- Father Bladder CA- Mother  Social History: Last updated: 09/21/2008 Patient states former smoker. Quit in 1980.  Smoked 2 ppd x 20 years. No ETOH Widowed  Children Son lives with pt. Retired  Risk Factors: Smoking Status: quit (11/14/2007)  Review of Systems      See HPI       The patient complains of dyspnea on exertion and peripheral edema.  The patient denies anorexia, fever, weight loss, weight gain, vision loss, decreased hearing, hoarseness, chest pain, syncope, prolonged cough, headaches, hemoptysis, abdominal pain, and severe indigestion/heartburn.    Vital Signs:  Patient profile:   77 year old male Height:  66 inches Weight:      168.38 pounds BMI:     27.28 O2 Sat:      95 % on Room air Pulse rate:   81 / minute BP sitting:   130 / 76  (left arm) Cuff size:   regular  Vitals Entered By: Reynaldo Minium CMA (November 04, 2009 2:58 PM)  O2 Flow:  Room air  Physical Exam  Additional Exam:  General: A/Ox3; pleasant and cooperative, NAD, SKIN: no rash, lesions NODES: no lymphadenopathy HEENT: [place]/AT, EOM- WNL, Conjuctivae- clear, PERRLA, TM-WNL, Nose- clear, Throat- clear and wnl NECK: Supple w/ fair ROM, JVD- trace, normal carotid impulses w/o bruits Thyroid-  CHEST: Unlabored slow wheeze., raspy without rales or dullness HEART: RRR, no m/g/r heard ABDOMEN:  WUJ:WJXB, nl pulses, sock line edema  NEURO: Grossly intact to observation      Impression & Recommendations:  Problem # 1:  COPD (ICD-496) Variable nonspecific chest congestion may include occasional mild fluid overload. I don't believe he has an infection now. i will let him try as needed diuretic when he gets more congested.  Medications Added to Medication List This Visit: 1)  Maxzide-25 37.5-25 Mg Tabs (Triamterene-hctz) .Marland Kitchen.. 1 daily as needed diuretic  Other Orders: Est. Patient Level III (14782)  Patient Instructions: 1)  Please schedule a follow-up appointment in 6 months. 2)  Script to try diuretic once daily if you think you may be a little fluid overloaded. See if it makes your lungs feel clearer and legs less tight. Prescriptions: MAXZIDE-25 37.5-25 MG TABS (TRIAMTERENE-HCTZ) 1 daily as needed diuretic  #20 x 1   Entered and Authorized by:   Waymon Budge MD   Signed by:   Waymon Budge MD on 11/04/2009   Method used:   Print then Give to Patient   RxID:   [id number]

## 2010-11-22 NOTE — Progress Notes (Signed)
Summary: cough, chest congestion > avelox, pred taper  Phone Note Call from Patient Call back at Home Phone [phone]   Caller: Patient Call For: young Summary of Call: Pt states he has only one lung and it's seems to be stopped up x5-6 days, wants to be seen today, pls advise.//[facility] Initial call taken by: Darletta Moll,  August 23, 2010 1:33 PM  Follow-up for Phone Call        Pt c/o chest congestion, productive cough with white phlegm, "rattling soud in chest" wheezing, increased SOB x 3 days. Pt denies fever. Pt is requestign to be seen today. I offered appt for tomorrow but he insists onbeing worked in today. Pleae advise.Carron Curie CMA  August 23, 2010 1:59 PM  Additional Follow-up for Phone Call Additional follow up Details #1::        No appts open until tomorrow-per CDY give patient prednisone 10mg  #20 take 4 x 2 days, 3 x 2 days, 2 x 2 days, 1 x 2 days, then stop. no refills. Also, give patient Avelox 400mg  #5 take 1 by mouth once daily no refills.Reynaldo Minium CMA  August 23, 2010 2:17 PM     Additional Follow-up for Phone Call Additional follow up Details #2::    Called, spoke with pt.  He was informed bc no openings until tomorrow with CY, CY will call in pred taper and avelox.  Directions given to pt.  He is ok with this and verbalized understanding.  [facility]ware [facility]xs [facility] [address] Follow-up by: Gweneth Dimitri RN,  August 23, 2010 3:35 PM  New/Updated Medications: PREDNISONE 10 MG TABS (PREDNISONE) take 4 x 2 days, 3 x 2 days, 2 x 2 days, 1 x 2 days, then stop. AVELOX 400 MG TABS (MOXIFLOXACIN HCL) Take 1 tablet by mouth once a day Prescriptions: AVELOX 400 MG TABS (MOXIFLOXACIN HCL) Take 1 tablet by mouth once a day  #5 x 0   Entered by:   Gweneth Dimitri RN   Authorized by:   Waymon Budge MD   Signed by:   Gweneth Dimitri RN on 08/23/2010   Method used:   [facility]        [facility]  [address] [phone]* (retail)       [address]       [place], [place]  [id number]       Ph: [phone] or [phone]       Fax: [phone]   RxID:   [id number] PREDNISONE 10 MG TABS (PREDNISONE) take 4 x 2 days, 3 x 2 days, 2 x 2 days, 1 x 2 days, then stop.  #20 x 0   Entered by:   Gweneth Dimitri RN   Authorized by:   Waymon Budge MD   Signed by:   Gweneth Dimitri RN on 08/23/2010   Method used:   [facility] to        [facility] [phone]* (retail)       [address]       [place], [place]  [id number]       Ph: [phone] or [phone]       Fax: [phone]   RxID:   [id number]

## 2010-11-22 NOTE — Assessment & Plan Note (Signed)
Summary: 6 months/ mbw   Primary Provider/Referring Provider:  [facility]  CC:  4 month followup.  Pt states breathing okay- gets more SOB when exposed to the hot/humid weather..  History of Present Illness: August 06, 2009- COPD,asthma, bronchits, hx lung cancer/ bilateral lobectomies Chronic cough has dried up in last 2 days with cooler weather. Was white- no green or blood, Denies fever, chest pain, sorethroat. Notes BP up, no weight loss, pain or fever.  November 04, 2009- COPD, asthma, bronchitis, hx lung ca/ bilateral lobectomies Still sees Dr Susa Simmonds at [facility] once yearly for f/u lung ca. Was very clear last week with him, then 5 days later with no exposure got  very congested without fever, green or sore throat. Now at baseline, denying productive cough. Admitting ankle edema. Elevates HOB, little nocturia.  January 17, 2010- COPD, asthma, bronchitis, hx lung cancer/ bilateral lobectomies Got through winter ok. Productive cough is variable with white, nonbloody phlegm. Dyspnea with exertion seems to slowly porogress. Had scheduled f/u at [facility] for his hx of lung cancer and they asked he remember to get f/u CXR here. Dr Deborah Chalk asked he continue diuretic script that  we wrote.   May 16, 2010- COPD, asthma, bronchitis, hx lung cancer/ bilateral lobectomies He continues to go to [facility] yearly. He was switched to [employer] which does about as much good as his Combivent did.  Uses nebulizer only about once a month. Has some daily chest rattle and a bit of wheeze, dyspneic with any brisk or sustained walk. He doesn't feel there has been overall change in symptoms or exercise tolerance.  Mostly his cough is dry.  Asthma History    Initial Asthma Severity Rating:    Age range: 12+ years    Symptoms: 0-2 days/week    Nighttime Awakenings: 0-2/month    Interferes w/ normal activity: no limitations    SABA use (not for EIB): 0-2 days/week    Asthma Severity Assessment: Intermittent   Preventive  Screening-Counseling & Management  Alcohol-Tobacco     Smoking Status: quit     Packs/Day: 2.0     Year Started: 1960     Year Quit: 1980  Current Medications (verified): 1)  Proair Hfa 108 (90 Base) Mcg/act Aers (Albuterol Sulfate) .... 2 Puffs Every 4-6 Hrs As Needed 2)  Bayer Aspirin Ec Low Dose 81 Mg  Tbec (Aspirin) .... Take 1 Tablet By Mouth Once A Day 3)  Albuterol Sulfate (2.5 Mg/90ml) 0.083%  Nebu (Albuterol Sulfate) .Marland Kitchen.. 1 Every 4 Hours As Needed 4)  Amlodipine Besylate 5 Mg Tabs (Amlodipine Besylate) .... Take 2 By Mouth Two Times A Day 5)  Omeprazole 20 Mg Cpdr (Omeprazole) .... Take 1 By Mouth Once Daily 6)  Maxzide-25 37.5-25 Mg Tabs (Triamterene-Hctz) .Marland Kitchen.. 1 Daily As Needed Diuretic  Allergies (verified): No Known Drug Allergies  Past History:  Past Medical History: Last updated: 12/13/2007 COUGH (ICD-786.2) Hx of CARCINOMA, LUNG (ICD-162.9) BRONCHITIS, ACUTE (ICD-466.0) GERD (ICD-530.81) HYPERCHOLESTEROLEMIA (ICD-272.0) HYPERTENSION, BENIGN (ICD-401.1) ASTHMA (ICD-493.90) COPD (ICD-496)  Past Surgical History: Last updated: 01/18/2009 Left lower lobectomy for cancer 1997  RULL lung nodule removed for cancer 2002  Family History: Last updated: 09/21/2008 negative for respiratory disease Heart Disease- Father Bladder CA- Mother  Social History: Last updated: 09/21/2008 Patient states former smoker. Quit in 1980.  Smoked 2 ppd x 20 years. No ETOH Widowed  Children Son lives with pt. Retired  Risk Factors: Smoking Status: quit (05/16/2010) Packs/Day: 2.0 (05/16/2010)  Social History: Packs/Day:  2.0  Review of Systems      See HPI       The patient complains of shortness of breath with activity, productive cough, and non-productive cough.  The patient denies shortness of breath at rest, coughing up blood, chest pain, irregular heartbeats, acid heartburn, indigestion, loss of appetite, weight change, abdominal pain, difficulty swallowing, sore  throat, tooth/dental problems, headaches, nasal congestion/difficulty breathing through nose, and sneezing.    Vital Signs:  Patient profile:   77 year old male Weight:      162 pounds O2 Sat:      95 % on Room air Pulse rate:   83 / minute BP sitting:   140 / 76  (left arm)  Vitals Entered By: Vernie Murders (May 16, 2010 3:11 PM)  O2 Flow:  Room air  Physical Exam  Additional Exam:  General: A/Ox3; pleasant and cooperative, NAD, SKIN: no rash, lesions NODES: no lymphadenopathy HEENT: [place]/AT, EOM- WNL, Conjuctivae- clear, PERRLA, TM-WNL, Nose- clear, Throat- clear and wnl, Mallampati  III NECK: Supple w/ fair ROM, JVD- trace, normal carotid impulses w/o bruits Thyroid-  CHEST: Coarse raspy sounds especially left mid chest HEART: RRR, no m/g/r heard ABDOMEN: mild potbelly DDU:KGUR, nl pulses, sock line edema  NEURO: Grossly intact to observation      Impression & Recommendations:  Problem # 1:  COPD (ICD-496) He is stable with mixed emphsema and bronchitic components including productive cough when he first lies down. He doesn't see much effect from bronchodilators, so I will leave him with his Proair after discussion. He is encouraged to maintain stamina with regular walking, but is having to pace himself in this heat.  Problem # 2:  Hx of CARCINOMA, LUNG (ICD-162.9) No evidence of recurrence so far. He will be watched long-term.  Medications Added to Medication List This Visit: 1)  Proair Hfa 108 (90 Base) Mcg/act Aers (Albuterol sulfate) .... 2 puffs every 4-6 hrs as needed  Other Orders: Est. Patient Level IV (42706)  Patient Instructions: 1)  Please schedule a follow-up appointment in 6 months. 2)  Call sooner if I can be of service.

## 2011-03-07 NOTE — Assessment & Plan Note (Signed)
[facility]                             PULMONARY OFFICE NOTE   Cesar [facility]                          MRN:          [id number]  DATE:06/21/2007                            DOB:          [birth year]    HISTORY:  A 77 year old white male, status post left lower lobectomy and  right upper lobectomy for cancer with a baseline FEV1 of 1.45, which is  59% predicted documented in 2005, and paroxysms of dyspnea with  subjective wheezing that could be heard across the room  intermittently.  He comes in today following what was supposed to be a  medication reconciliation visit where he was to declare whether or not  which medicines were maintenance versus p.r.n. and then record them on a  medication calendar.  He thoroughly misunderstood the instructions as  well as the intent of a medication calendar, and comes back today with  just his medicines, having confused his maintenance and p.r.n. once  again.  He is intermittently complaining of wheezing, but cannot  identify any obvious trigger or alleviating factor except that he says  it gets better on prednisone.  He has not had prednisone, recently,  however.  He has been worse for the last 5 days, but for some reason  is much better today, having not changed his regimen.   In terms of accounting for his regimen, I was not able to make any sense  of what he is doing, because he, again, fails to understand the  difference between a maintenance and p.r.n.  He has made multiple  changes in the regimen since his previous visit.  That is, additions and  subtractions, and did not bring a sheet with him.  However, I did  inventory the medications to the best of my ability in the column dated  June 21, 2007.   PHYSICAL EXAMINATION:  He is a minimally chronically ill-appearing,  ambulatory white male in no acute distress.  He is afebrile with normal vital signs except for a blood pressure of  160/94 which he says  is much higher than he has been getting at home  since the change off of Benicar and onto valsartan.  HEENT:  Unremarkable.  Oropharynx is clear.  Lung fields reveal diminished breath sounds, bilateral wheezing.  He has  prominent pseudo wheeze that is much better with pursed-lip maneuver,  and a positive Hoover's sign at the end of inspiration with pot belly  deformity of his abdomen.  HEART:  Regular rhythm without murmurs, gallops, or rubs.  ABDOMEN:  Soft, benign.  EXTREMITIES:  Warm without calf tenderness, cyanosis, clubbing, or  edema.   Hemoglobin saturation 96% on room air.   IMPRESSION:  This patient has a combination of chronic obstructive  pulmonary disease and restrictive lung disease from previous lung cancer  with variable dyspnea which suggests  asthma.  However, the fact that  his wheezing can be heard across the room would suggest that vocal  cord dysfunction, perhaps, due to reflux is the major mechanism.  I,  therefore, reviewed pursed-lip breathing and/or  pseudo wheezing.   For now I recommended the following:  1. First, I reviewed dietary measures with him again regarding the      management of reflux and also reviewed how to do pursed-lip      maneuver breathing.  2. Increase omeprazole up to 20 mg b.i.d., the 2nd dose taken 30      minutes before supper when wheezing can be heard across the room.  3. I offered him prednisone but he declined, stating it is getting      better on its own this time and does not feel it is necessary.  4. I reviewed with him optimal use of Combivent which is 2 puffs      q.i.d. for now with p.r.n. use of the nebulizer only for rescue      purposes.  5. One option might be to switch him over to DuoNeb plus Pulmicort      q.i.d., but it turns out he is getting the Combivent free through      the [place] and he would like to continue with this for now.  6. When he returns, however, in 6 weeks, depending on his response to      the above  measures, I think we need to consider switching him to      the nebulizer which will irritate his airway less and also provide      him with inhaled steroids, since there appears to be a component of      asthma, although most of it is pseudo asthma based on his history      and exam today.     Charlaine Dalton. Sherene Sires, MD, [facility]  Electronically Signed    MBW/MedQ  DD: 06/21/2007  DT: 06/22/2007  Job #: [id number]   cc:   Olene Craven, M.D.

## 2011-03-07 NOTE — Assessment & Plan Note (Signed)
[facility]                             PULMONARY OFFICE NOTE   ALHASSAN, EVERINGHAM                          MRN:          [id number]  DATE:09/02/2007                            DOB:          1928-04-20    A 77 year old white male who carries a diagnosis of COPD but returns now  for PFT and is status post lobectomy of the left lower lobe and right  upper lobe with enough variability to warrant a trial of Symbicort  160/4.5 2 puffs b.i.d. on his last visit.  After his last visit, it was  confirmed that he had a 15% improvement after bronchodilators.  The  patient has continued to struggle with medications despite having a  calendar that clearly delineates maintenance versus p.r.n.s and is  reflected on the medicine sheet column dated 09/02/2007.  He says he has  been slacking off Symbicort and depending more on the Combivent over  the last several days for example.  This was clearly not the intent for  Symbicort versus Combivent and was outlined to him in writing  previously.   Despite this, he denies any significant limiting dyspnea at present,  orthopnea, PND, nocturnal exacerbation of coughing, wheezing, or early  morning chest congestion.  He has stable vital signs.   PHYSICAL EXAMINATION:  He is a robust, pleasant, white man in no acute  distress.  He has stable vital signs.  HEENT:  Unremarkable.  Nares clear.  LUNG FIELDS:  Revealed diminished breath sounds but no wheezing.  CARDIAC:  There is a regular rate and rhythm without murmurs, rubs or  gallops.  ABDOMEN:  Soft, nondistended.  EXTREMITIES:  Without calf tenderness, cyanosis, clubbing or edema.   PFTs were reviewed with the patient in detail, indicating again that the  cup was much more half full than half empty based on the fact that his  FEV1 improved 15% after bronchodilators to a level of 60% FEV1 for  height and age.   The issue will be trying to maintain him at this level  without losing  grown again.  In this regard, it is critical that he understand the  nature of a maintenance versus a p.r.n. medicine.  I spent almost 30  minutes with him today on the following issues:  1. First I would like to switch Combivent to p.r.n. and help him      understand that it is purely for symptomatic  benefit.  2. Since he has documented asthma, he needs to be taking the Symbicort      perfectly regularly to keep the horse in the barn.  That is an      analogy I thought might help the patient understand this concept.      I asked him to continue 160/4.5 2 puffs b.i.d. for the next 3      months and then return here for followup.  In the meantime if he      finds that he is better, then Combivent can be used p.r.n.   I spent extra time also making sure  that he can inhale effectively.  His  inspiratory capacity is relatively low unless he relaxes all the  residual volume, which seemed difficult for him to do here today.  Nevertheless, I believe he has mastered the technique well enough to  maintain on aerosols and at this point should not need nebulizers at  all.   Follow up here in 3 months, sooner if needed.     Charlaine Dalton. Sherene Sires, MD, [facility]  Electronically Signed    MBW/MedQ  DD: 09/02/2007  DT: 09/02/2007  Job #: [id number]   cc:   Olene Craven, M.D.

## 2011-03-07 NOTE — Assessment & Plan Note (Signed)
[facility]                             PULMONARY OFFICE NOTE   Cesar, Walker                          MRN:          [id number]  DATE:02/21/2007                            DOB:          [birth year]    This is a very pleasant 77 year old gentleman who I follow here for  chronic obstructive pulmonary disease.  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  Initially done at  [facility], and 2nd at [facility].  I have not seen the patient since  July 2007.  He has been seen intermittently by our nurse practitioner in  the interim.   Patient denies any complaints today, however, he does get periodic x-  rays to follow up on previous carcinoma of the lung.  He has noticed  some issues with his hypertension not being as well controlled on Diovan  160/12.5.  ACE inhibitors do give him issues with cough.   CURRENT MEDICATIONS:  As noted on the intake sheet.  These have been  reviewed, and are accurate.   PHYSICAL EXAMINATION:  VITALS:  Are as noted.  Oxygen saturation is 97%  on room air.  GENERAL:  This is a well-developed, well-nourished gentleman who is in  no acute distress.  HEENT:  Examination is unremarkable for age.  NECK:  Supple.  No adenopathy noted.  No JVD.  LUNGS:  Clear to auscultation bilaterally.  CARDIAC EXAMINATION:  Regular rate and rhythm.  No rubs, murmurs, or  gallops heard.  EXTREMITIES:  The patient has no cyanosis, no clubbing, no edema noted.   We did review his medications in detail with him.  He is still using  Combivent 2 inhalations 4 times a day.  This controls his dyspnea  symptoms well.  We also reviewed his blood pressure log, and it does  indeed show that he has issues, particularly with systolic hypertension.   We did perform chest x-ray today, which shows postoperative changes, but  no new issue.   IMPRESSION:  1. Chronic obstructive pulmonary disease.  The patient is  well      compensated.  2. Carcinoma of the lung with nonsmall.  Prior bilateral lobectomies      for the same.  Treated elsewhere.  No evidence of recurrence.  3. Hypertension.  Poorly controlled.   PLAN:  1. Will be to change the patient from Diovan to Benicar 20/12.5.      Enough samples were given for him to give a trial of approximately      2 months' time.  2. The patient will our nurse practitioner, Tammy Parrett, in 2 weeks      to establish whether his blood pressure control is better.  3. Followup will be in 3 months' time with Dr. Levy Pupa, as I will      be leaving the practice.      The patient, subsequently after that, will follow blood-pressure-      wise with his primary care physician.     Gailen Shelter, MD  Electronically Signed    CLG/MedQ  DD: 02/22/2007  DT: 02/22/2007  Job #: [id number]

## 2011-03-07 NOTE — Assessment & Plan Note (Signed)
[facility]                             PULMONARY OFFICE NOTE   ILARIO, DHALIWAL                          MRN:          [id number]  DATE:04/30/2007                            DOB:          02/12/1928    HISTORY OF PRESENT ILLNESS:  The patient is a 77 year old white male,  patient of Dr. Sherene Sires who has a known history of COPD, complicated by  underlying gastroesophageal reflux.  The patient has a history of  recurrent cough felt secondary to ACE inhibitors.  The patient presents  today for a 2 week followup.  The patient recently had a COPD  exacerbation and was given a 7-day course of doxycycline and a  prednisone taper.  Patient returns today reporting that he is much  improved with decreased cough and congestion.  The patient's cough is  much improved since he is off of his ACE inhibitors.  He has brought all  of his medications in today for review, which are correct with our  medication list.  The patient does receive several of his medicines at  the [place] system.  The patient denies any hemoptysis, orthopnea, PND or leg  swelling.   PAST MEDICAL HISTORY:  Is reviewed.   CURRENT MEDICATIONS:  Reviewed.   PHYSICAL EXAMINATION:  The patient is a pleasant male in no acute  distress.  He is afebrile with stable vital signs.  HEENT:  Unremarkable.  NECK:  Supple without adenopathy.  Lung sounds reveal diminished breath sounds at the bases.  CARDIAC:  Regular rate and rhythm.  ABDOMEN:  Soft and nontender.  EXTREMITIES:  Warm without any edema.   IMPRESSION/PLAN:  1. Recent chronic obstructive pulmonary disease exacerbation now      resolved after antibiotics and prednisone taper.  2. Complex medication regimen.  Patient medication reviewed in detail.      Patient education was given.  A computerized medication calendar      was completed for this patient and      reviewed in detail.  The patient is aware to bring this back to      each office  visit.  The patient is to return back with Dr. Sherene Sires in      2 to 3 months or sooner if needed.     Rubye Oaks, NP  Electronically Signed      Charlaine Dalton. Sherene Sires, MD, [facility]  Electronically Signed   TP/MedQ  DD: 04/30/2007  DT: 04/30/2007  Job #: [id number]

## 2011-03-07 NOTE — Assessment & Plan Note (Signed)
[facility]                             PULMONARY OFFICE NOTE   Cesar Walker, Cesar Walker                          MRN:          [id number]  DATE:08/05/2007                            DOB:          1928-03-11    HISTORY:  This 77 year old white male with documented COPD and  bilobectomy (the left lower lobe and the right upper lobe for cancer  remotely) with a baseline FEV-1 of around 1.45 in 2005 returns for PFTs  today on a typical day.  He is presently maintained on Combivent at  two puffs four times a day and actually took the Combivent before coming  to the office.  He tells me he still gets significant benefit from using  the Combivent and indeed on PFTs, does show a response (see comments  below).  He says he is not quite as good as he gets when he takes a  course of prednisone but has not had a recent round of prednisone.  He  denies any significant chest pain, fever, chills, sweats, orthopnea, PND  or leg swelling.   For full list of medications please see face sheet dated August 05, 2007, but note the patient failed again to bring in the medication list  as requested with him that we had generated for him here previously.   PHYSICAL EXAMINATION:  He is a pleasant but somber ambulatory white male  in no acute distress.  He has stable vital signs.  HEENT:  Unremarkable.  Pharynx clear.  LUNGS:  Lung fields reveal diminished breath sounds but no wheezing  (after albuterol given in the PFT lab).  HEART:  There is regular rhythm without murmur, gallop, or rub.  ABDOMEN:  Soft, benign.  EXTREMITIES:  Warm without calf tenderness, cyanosis, or clubbing.   MDI technique was reviewed and it improved from 50% to 75+%  effectiveness.  His problem is that he fails to relax and breathe down  to residual capacity before trying to take a deep breath.   PFTs were performed today and again indicated an FEV-1 of 1.45 but this  is after bronchodilator (a 15%  improvement over baseline).  Diffusing  capacity is normal.   IMPRESSION:  This patient clearly has elements of both chronic  obstructive pulmonary disease and restrictive change, but the most  important to acknowledge is the fact that he has significant asthma.  Combivent by itself is not adequate therapy for this and therefore I  recommended that he try Symbicort 164.5 two puffs b.i.d. with the goal  of improving activity tolerance up to the level where he previously  achieved only with systemic steroids, and minimize the need for p.r.n.  Combivent.   The second issue that we discussed today at length was medication  reconciliation.  He is having trouble coordinating his care between here  and the [place].  In this setting it is going to be impossible to take care  of him using a bag of pills approach and I have pleaded with him to  bring the medication calendar back with him when  he returns and that if  he cannot find it, if he will simply bring all of his medicines with  him, we will generate another calendar for him.   Follow-up for this purpose will be in four weeks.  Will see him sooner  if needed.     Charlaine Dalton. Sherene Sires, MD, [facility]  Electronically Signed    MBW/MedQ  DD: 08/05/2007  DT: 08/05/2007  Job #: [id number]

## 2011-03-07 NOTE — Assessment & Plan Note (Signed)
[facility]                             PULMONARY OFFICE NOTE   Cesar Walker, Cesar Walker                          MRN:          [id number]  DATE:03/07/2007                            DOB:          Feb 01, 1928    HISTORY OF PRESENT ILLNESS:  The patient is a very pleasant 77 year old  white male patient of Dr. Jayme Cloud who has a known history of COPD and a  previous non-small-cell lung carcinoma.  The patient had recently been  having some trouble with recurrent COPD exacerbation and persistent  cough.  The patient was taken off of his ACE inhibitor 2 months ago.  Last visit, the patient's blood pressure was not optimally controlled on  his diovan hydrochlorothiazide 160/12.5, and the patient was changed  over to Benicar 20/12.5.  The patient reports that blood pressures have  continued to be elevated with systolic pressures about 140-180.  The  patient denies any chest pain, increased shortness of breath, orthopnea,  PND, or leg swelling.  The patient does report that his breathing does  seem to be improved, and cough has resolved.   PAST MEDICAL HISTORY:  Reviewed.   CURRENT MEDICATIONS:  Reviewed.   PHYSICAL EXAM:  The patient is a pleasant male, in no acute distress.  He is afebrile with blood pressure at 160/90, O2 saturation is 95% on  room air.  HEENT:  Unremarkable.  NECK:  Supple without cervical adenopathy.  No JVD.  LUNGS:  Diminished breath sounds at the bases.  Otherwise, clear.  CARDIAC:  Regular rate.  ABDOMEN:  Soft and nontender.  EXTREMITIES:  Warm without any calf tenderness, cyanosis, clubbing, or  edema.   IMPRESSION AND PLAN:  1. Chronic obstructive pulmonary disease, currently well-compensated.      Continue on his present regimen.  2. Hypertension.  Not optimally controlled.  The patient will increase      Benicar up to 40/25 mg daily.  The patient is instructed to limit      his nonsteroidal use for arthritis and to use  Tylenol,      which could also be raising his blood pressure.  The patient will      return here in 4 weeks for followup with a daily blood pressure      log.      Rubye Oaks, NP       C. Danice Goltz, MD    TP/MedQ  DD: 03/07/2007  DT: 03/07/2007  Job #: [id number]

## 2011-03-07 NOTE — Assessment & Plan Note (Signed)
[facility]                             PULMONARY OFFICE NOTE   CONNOR, MEACHAM                          MRN:          [id number]  DATE:04/11/2007                            DOB:          01-19-1928    HISTORY:  This is a 77 year old white male who carries the diagnosis of  COPD with GERD.  Previously intolerant of ACE inhibitors, now maintained  off of ACE inhibitors but complaining of increasing cough and congestion  over the last 3 days.  For a full inventory of medications, please see  Face Sheet, dated April 11, 2007; although, note that this patient is  very confused with his medicines, he cannot recognize them by name at  all.  He also has no insight into contingency planning.   Apparently, he was coughing up slightly brown sputum for the last 3 days  associated with increased dyspnea and chest congestion.  He denies any  pleuritic pain, orthopnea, PND or dyspnea at rest.  Combivent seems to  help some (but, note, I am not actually sure he has Combivent, after  much questioning about the color of the inhaler).  He also apparently  has a nebulizer that he has used but does not know the name of it, what  he has put in as solution.   PHYSICAL EXAMINATION:  He is a pleasant ambulatory white male in no  acute distress, he is afebrile, normal vital signs.  HEENT:  Unremarkable.  OROPHARYNX:  Clear.  DENTITION:  Intact.  NECK:  Supple without cervical adenopathy or tenderness.  Trachea is  midline, no thyromegaly.  LUNG FIELDS:  Reveal minimal rhonchi bilaterally.  HEART:  Regular rate and rhythm without murmur, gallop or rub.  ABDOMEN:  Soft, benign.  EXTREMITIES:  Warm without any calf tenderness, cyanosis, clubbing or  edema.   IMPRESSION:  1. Chronic obstructive pulmonary disease exacerbation associated with      asthmatic bronchitis but discolored sputum, suggesting      tracheobronchitis.  Recommendation is doxycycline for 7 days and     prednisone for 6 days.  2. Complex medical regimen.  The more I talk to the patient the more      confused I became as to exactly what he is doing.  Hopefully he has      not restarted the ACE inhibitor, which Dr. Jayme Cloud specifically      was concerned about, given the fact that he has a diagnosis of      gastroesophageal reflux disease.  He also is not following a      gastroesophageal reflux disease diet nor consistently using proton      pump inhibitor therapy, which I have reviewed with him today, and      emphasized followup should be in two weeks with our nurse      practitioner for full medication reconciliation purposes and at      that time he should bring every medicine he has, including whatever      he uses in his nebulizer.   The patient was quite  stubborn about this issue, stating several times  Well I know my medicines and at the same time could not correlate the  name of the medicine with what he is  doing at home.  This is a recipe for adverse effects from medications  and I emphasized to the  patient that if he is going to be followed in this clinic and expect to  get quality medical care we need to first start with accurate medication  reconciliation and then maintain it.     Charlaine Dalton. Sherene Sires, MD, [facility]  Electronically Signed    MBW/MedQ  DD: 04/18/2007  DT: 04/18/2007  Job #: [id number]

## 2011-03-10 NOTE — Assessment & Plan Note (Signed)
[facility]                             PULMONARY OFFICE NOTE   Cesar Walker, Cesar Walker                          MRN:          [id number]  DATE:01/21/2007                            DOB:          December 18, 1927    HISTORY OF PRESENT ILLNESS:  The patient is a 77 year old white male  patient of Dr. Jayme Cloud who has a known history of COPD with previous  lung cancer with previous right upper lobe and left lower lobectomy.  The patient returns today for a 2 week followup.  Last visit patient had  an asthmatic bronchitic flare and has now finished a 10 day course of  antibiotics along with a prednisone taper.  The patient returns today  and reports that symptoms are much improved with decreased cough,  congestion, and wheezing.  However, the patient does complain that he  has some increased cough at night and does have a persistent cough at  night and some wheezing that seems to keeping him awake.  To note, the  patient is on ACE inhibitor which could be contributing to his upper  airway instability.  The patient denies any orthopnea, PND, fevers,  chills, purulent sputum.   PAST MEDICAL HISTORY:  Reviewed.   CURRENT MEDICATIONS:  Reviewed.   PHYSICAL EXAMINATION:  GENERAL:  The patient is a pleasant male, in no  acute distress.  VITAL SIGNS:  He is afebrile with stable vital signs.  O2 saturation is  96% on room air.  HEENT:  Unremarkable.  NECK:  Supple without cervical adenopathy.  No JVD.  LUNGS:  Sounds are diminished in the bases, otherwise clear.  The  patient does have some upper airway pseudo-wheezing.  CARDIAC:  Regular rate.  ABDOMEN:  Soft and nontender.  EXTREMITIES:  Warm without any edema.   IMPRESSION/PLAN:  Recent asthmatic bronchitic flare, seems to be much  improved.  However, patient does have some continued upper airway  instability which could be secondary to ACE inhibitor.  We will begin a  trial off of ACE inhibitor and begin Diovan  HCT 160/12.5 mg daily over  the next 4 weeks and we will continue our aggressive reflux prevention  with  omeprazole and anti-reflux preventative measures.  The patient will  recheck with Dr. Jayme Cloud in 4 weeks or sooner if needed.      Rubye Oaks, NP  Electronically Signed      Gailen Shelter, MD  Electronically Signed   TP/MedQ  DD: 01/23/2007  DT: 01/23/2007  Job #: [id number]

## 2011-03-10 NOTE — Op Note (Signed)
NAME:  Cesar Walker, Cesar Walker                             ACCOUNT NO.:  [id number]   MEDICAL RECORD NO.:  [id number]                   PATIENT TYPE:  AMB   LOCATION:  ENDO                                 FACILITY:  [facility]   PHYSICIAN:  Petra Kuba, M.D.                 DATE OF BIRTH:  [birth year]   DATE OF PROCEDURE:  08/12/2002  DATE OF DISCHARGE:                                 OPERATIVE REPORT   PROCEDURE:  EGD with biopsy.   INDICATIONS:  Patient with abdominal pain on diagnostic colonoscopy.  Consent was signed after risks, benefits, methods, options were thoroughly  discussed in the office before any premedications were given.   ADDITIONAL MEDICINE FOR THIS PROCEDURE:  Versed 2.   DESCRIPTION OF PROCEDURE:  The videoendoscope was inserted by direct vision.  The esophagus was normal.  He did have a small hiatal hernia.  The scope was  passed into the stomach and advanced for a normal antrum, normal pylorus,  into the duodenal bulb, pertinent for some mild bulbitis, and around the  __________ to a normal second, third, and probably even fourth part of the  duodenum.  No duodenal abnormalities were seen but the bulbitis.  The scope  was slowly withdrawn.  No other abnormalities were seen.  Once back in the  stomach the scope was retroflexed.  High in the cardia the hiatal hernia was  confirmed.  The fundus was normal.  Along the greater curve, the lesser  curve in the mid stomach some tiny polyps were seen, some of which were cold  biopsied.  The scope was straightened.  Direct visualization of the stomach  confirmed the polyps and ruled out any other abnormality.  Again, a few  biopsies were obtained.  Air was suctioned, the scope was slowly withdrawn.  Again, a good look at the esophagus on slow withdrawal was normal.  The  scope was removed.  The patient tolerated the procedure well.  There was no  obvious immediate complication.   ENDOSCOPIC DIAGNOSES:  1. Small hiatal hernia.  2.  Few gastric polyps, tiny to small, some of which biopsied.  3. Bulbitis.  4. Otherwise within normal limits in the third and fourth part of the     duodenum.    PLAN:  Await pathology.  Recheck his chart to see what further workup and  plans are needed.  Possibly change his pump inhibitor or add an  antispasmodic, but will wait on pathology and recheck symptoms at that  juncture to determine further workup and plans.                                                Petra Kuba, M.D.    MEM/MEDQ  D:  08/12/2002  T:  08/12/2002  Job:  [id number]   cc:   Colleen Can. Deborah Chalk, M.D.  [address]  [place]  [place] [address]  Fax: [phone]   Maryla Morrow. Modesto Charon, M.D.   Madolyn Frieze, M.D.  Hematology/Oncology  [facility]

## 2011-03-10 NOTE — Op Note (Signed)
NAME:  Cesar Walker, Cesar Walker                             ACCOUNT NO.:  [id number]   MEDICAL RECORD NO.:  [id number]                   PATIENT TYPE:  AMB   LOCATION:  ENDO                                 FACILITY:  [facility]   PHYSICIAN:  Petra Kuba, M.D.                 DATE OF BIRTH:  29-Nov-1927   DATE OF PROCEDURE:  08/12/2002  DATE OF DISCHARGE:                                 OPERATIVE REPORT   PROCEDURE:  Colonoscopy.   INDICATIONS:  Patient with persistent abdominal symptoms, history of colon  polyps.  Due for repeat screening.   Consent was signed after risks, benefits, methods, options thoroughly  discussed in the office on multiple occasions.   MEDICATIONS:  Demerol 70, Versed 7.   DESCRIPTION OF PROCEDURE:  Rectal inspection was pertinent for external  hemorrhoids.  Digital exam was negative.  The video pediatric adjustable  colonoscope was inserted, fairly easily advanced around the colon to the  cecum.  This did require rolling him on his back and with some abdominal  pressure.  On insertion a small hepatic flexure polyp was seen which was hot  biopsied x1.  No other abnormalities were seen on insertion.  The cecum was  identified by the appendiceal orifice and the ileocecal valve.  In fact, the  scope was inserted a short way in the terminal ileum which was normal.  Photo documentation was obtained.  The scope was slowly withdrawn.  Prep was  adequate.  There was some liquid stool that required washing and suction and  slow withdrawal through the colon.  Cecum, ascending, transverse,  descending, and sigmoid were all normal.  Back in the rectum a tiny probable  hyperplastic-appearing polyp was seen and was hot biopsied x1.  The scope  was retroflexed.  Pertinent for some internal hemorrhoids and anal papillae.  The scope was straightened, readvanced short way up the left side of the  colon, air was suctioned, scope removed.  The patient tolerated the  procedure well.  There  was no obvious immediate complication.   ENDOSCOPIC DIAGNOSES:  1. Internal and external hemorrhoids with anal papillae.  2. Two tiny small polyps hot biopsied in the rectum and the hepatic flexure.  3. Otherwise within normal limits to terminal ileum.    PLAN:  Await pathology to determine a future colonic screen.  Probably  recheck in five years, and continue workup with an EGD for his GI symptoms.                                                 Petra Kuba, M.D.    MEM/MEDQ  D:  08/12/2002  T:  08/12/2002  Job:  [id number]   cc:   Thelma Barge P. Modesto Charon, M.D.  Jean Rosenthal, M.D.  [facility]

## 2011-03-10 NOTE — Assessment & Plan Note (Signed)
[facility]                             PULMONARY OFFICE NOTE   Cesar Walker, KNUEPPEL                          MRN:          [id number]  DATE:01/07/2007                            DOB:          1928/06/05    HISTORY OF PRESENT ILLNESS:  The patient is a 77 year old white male,  patient of Dr. Jayme Cloud who has a known history of COPD and previous  lung carcinoma with previous right upper lobe an left lower lobectomies.  The patient presents for an acute office visit complaining of a 1 week  history of increased productive cough with thick yellow sputum, nasal  congestion, wheezing and shortness of breath.  The patient was seen by  his primary care physician and started on amoxicillin which he has  completed 4 out of the last 10 days.  The patient denies any hemoptysis,  orthopnea, PND, leg swelling or chest pain.   PAST MEDICAL HISTORY:  Is reviewed.   CURRENT MEDICATIONS:  Reviewed.   PHYSICAL EXAMINATION:  The patient is a pleasant male in no acute  distress.  He is afebrile with stable vital signs.  O2 saturation is 95% on room  air.  HEENT:  Nasal mucosa is moist , nontender sinuses, TMs are normal.  NECK:  Is supple without cervical adenopathy.  No JVD.  LUNGS:  Sounds reveal coarse rhonchi bilaterally with a few expiratory  wheeze.  The patient also has a upper airway pseudo wheeze.  CARDIAC:  Regular rate.  ABDOMEN:  Is soft and nontender.  EXTREMITIES:  Are warm without any calf cyanosis, clubbing or edema.   IMPRESSION/PLAN:  Asthmatic bronchitis flare and chronic obstructive  pulmonary disease exacerbation.  The patient is to finish amoxicillin as  recommended.  Add Mucinex Dm twice daily.  Will begin a prednisone taper  over the next week.  He may use Phenergan VC with codeine #8 ounces, 1  to 2 teaspoons every 4 to 6 hours as needed for cough suppression.  Patient is aware of sedating effect.   A note, the patient is on an ACE-inhibitor  which could be contributing  to his increase upper airway pseudo wheezing and upper airway  instability.  If symptoms do not improve shortly, may  need to discontinue ACE-inhibitor and change over to ARB if necessary.  The patient will return back here in 1 week or sooner if needed.      Rubye Oaks, NP  Electronically Signed      Gailen Shelter, MD  Electronically Signed   TP/MedQ  DD: 01/07/2007  DT: 01/08/2007  Job #: [id number]

## 2011-03-27 ENCOUNTER — Encounter: Payer: Self-pay | Admitting: Cardiology

## 2011-04-10 ENCOUNTER — Ambulatory Visit (INDEPENDENT_AMBULATORY_CARE_PROVIDER_SITE_OTHER): Payer: Medicare Other | Admitting: Cardiology

## 2011-04-10 ENCOUNTER — Encounter: Payer: Self-pay | Admitting: Cardiology

## 2011-04-10 DIAGNOSIS — J449 Chronic obstructive pulmonary disease, unspecified: Secondary | ICD-10-CM

## 2011-04-10 DIAGNOSIS — E7800 Pure hypercholesterolemia, unspecified: Secondary | ICD-10-CM

## 2011-04-10 DIAGNOSIS — R0602 Shortness of breath: Secondary | ICD-10-CM

## 2011-04-10 DIAGNOSIS — I10 Essential (primary) hypertension: Secondary | ICD-10-CM

## 2011-04-10 NOTE — Patient Instructions (Signed)
Your physician wants you to follow-up in:  6 months. You will receive a reminder letter in the mail two months in advance. If you don't receive a letter, please call our office to schedule the follow-up appointment.   

## 2011-04-10 NOTE — Assessment & Plan Note (Signed)
Managed by primary care. 

## 2011-04-10 NOTE — Assessment & Plan Note (Signed)
Blood pressure borderline. We will follow this and increase medications as needed.

## 2011-04-10 NOTE — Progress Notes (Signed)
HPI: 77 yo male previously followed by Dr. Deborah Chalk for fu of hypertension and hyperlipidemia. Nuclear study in July of 2009 showed no ischemia or infarction and an ejection fraction of 72%. Echocardiogram in February of 2009 showed an ejection fraction of 55-60%, mild left ventricular hypertrophy and impaired diastolic relaxation. Patient has chronic dyspnea on exertion following resection of his lung. There is no orthopnea or PND. There is no chest pain. There is no syncope.  Current Outpatient Prescriptions  Medication Sig Dispense Refill  . albuterol (PROAIR HFA) 108 (90 BASE) MCG/ACT inhaler Inhale 2 puffs into the lungs every 6 (six) hours as needed.        Marland Kitchen albuterol (PROVENTIL) (2.5 MG/3ML) 0.083% nebulizer solution Take 2.5 mg by nebulization every 4 (four) hours as needed.        Marland Kitchen amLODipine (NORVASC) 10 MG tablet Take 10 mg by mouth daily.        Marland Kitchen aspirin (BAYER ASPIRIN EC LOW DOSE) 81 MG EC tablet Take 81 mg by mouth daily.        Marland Kitchen lisinopril (PRINIVIL,ZESTRIL) 40 MG tablet Take 20 mg by mouth daily.        Marland Kitchen omeprazole (PRILOSEC) 20 MG capsule Take 20 mg by mouth as needed.       Marland Kitchen DISCONTD: amLODipine (NORVASC) 5 MG tablet Take 10 mg by mouth 2 (two) times daily.        Marland Kitchen DISCONTD: moxifloxacin (AVELOX) 400 MG tablet Take 400 mg by mouth daily.        Marland Kitchen DISCONTD: predniSONE (DELTASONE) 10 MG tablet Take 4 x 2 days, 3 x 2 days, 2 x 2 days, 1 x 2 days, then stop.       Marland Kitchen DISCONTD: triamterene-hydrochlorothiazide (MAXZIDE-25) 37.5-25 MG per tablet Take 1 tablet by mouth daily as needed.           Past Medical History  Diagnosis Date  . Carcinoma of lung   . GERD (gastroesophageal reflux disease)   . Hypercholesteremia   . HTN (hypertension), benign   . Asthma   . COPD (chronic obstructive pulmonary disease)   . Anxiety     Past Surgical History  Procedure Date  . Left lower lobectomy for cancer 1997  . Rull lung nodule removed for cancer 2002    History   Social  History  . Marital Status: Widowed    Spouse Name: N/A    Number of Children: N/A  . Years of Education: N/A   Occupational History  . retired    Social History Main Topics  . Smoking status: Former Smoker -- 2.0 packs/day for 20 years    Types: Cigarettes    Quit date: 10/23/1978  . Smokeless tobacco: Not on file  . Alcohol Use: No  . Drug Use: Not on file  . Sexually Active: Not on file   Other Topics Concern  . Not on file   Social History Narrative  . No narrative on file    ROS: Problems with pain in bilateral lower extremities at night but no fevers or chills, productive cough, hemoptysis, dysphasia, odynophagia, melena, hematochezia, dysuria, hematuria, rash, seizure activity, orthopnea, PND, pedal edema, claudication. Remaining systems are negative.  Physical Exam: Well-developed well-nourished in no acute distress.  Skin is warm and dry.  HEENT is normal.  Neck is supple. No thyromegaly.  Chest is mild expiratory wheeze Cardiovascular exam is regular rate and rhythm.  Abdominal exam nontender or distended. No masses palpated. Extremities show  trace edema. neuro grossly intact  ECG Sinus rhythm at a rate of 65. Right bundle branch block. Left anterior fascicular block.

## 2011-05-25 ENCOUNTER — Telehealth: Payer: Self-pay | Admitting: Cardiology

## 2011-05-25 NOTE — Telephone Encounter (Signed)
Mr. Copher came in to complete a ROI authorizing the release of any requested records to [facility]. I put a copy of his LOV & EKG in the courier to be mailed off.

## 2011-06-19 ENCOUNTER — Ambulatory Visit (INDEPENDENT_AMBULATORY_CARE_PROVIDER_SITE_OTHER)
Admission: RE | Admit: 2011-06-19 | Discharge: 2011-06-19 | Disposition: A | Discharge status: Home / Self Care | Payer: Medicare Other | Source: Ambulatory Visit | Attending: Adult Health | Admitting: Adult Health

## 2011-06-19 ENCOUNTER — Ambulatory Visit (INDEPENDENT_AMBULATORY_CARE_PROVIDER_SITE_OTHER): Payer: Medicare Other | Admitting: Adult Health

## 2011-06-19 ENCOUNTER — Encounter: Payer: Self-pay | Admitting: Adult Health

## 2011-06-19 VITALS — BP 116/62 | HR 70 | Temp 97.0°F | Wt 161.8 lb

## 2011-06-19 DIAGNOSIS — J449 Chronic obstructive pulmonary disease, unspecified: Secondary | ICD-10-CM

## 2011-06-19 DIAGNOSIS — R0609 Other forms of dyspnea: Secondary | ICD-10-CM

## 2011-06-19 DIAGNOSIS — R0600 Dyspnea, unspecified: Secondary | ICD-10-CM

## 2011-06-19 MED ORDER — AMOXICILLIN-POT CLAVULANATE 875-125 MG PO TABS: 1.0000 | ORAL_TABLET | Freq: Two times a day (BID) | ORAL | Status: AC

## 2011-06-19 MED ORDER — METHYLPREDNISOLONE ACETATE 80 MG/ML IJ SUSP
120.0000 mg | Freq: Once | INTRAMUSCULAR | Status: AC
  Administered 2011-06-19: 120 mg via INTRAMUSCULAR

## 2011-06-19 MED ORDER — PREDNISONE 10 MG PO TABS: ORAL_TABLET | ORAL | Status: AC

## 2011-06-19 NOTE — Progress Notes (Signed)
Addended by: Tommie Sams on: 06/19/2011 11:02 AM   Modules accepted: Orders

## 2011-06-19 NOTE — Assessment & Plan Note (Signed)
Exacerbation  Plan:  Augmentin 875mg  Twice daily  For 7 days  Mucinex DM Twice daily  As needed  Cough/congesiton  Fluids and rest  Prednisone taper over next week.  Please contact office for sooner follow up if symptoms do not improve or worsen or seek emergency care  follow up Dr. Maple Hudson  In 6 weeks  I will call with xray results.

## 2011-06-19 NOTE — Progress Notes (Signed)
Subjective:    Patient ID: Cesar Walker, male    DOB: 12-Mar-1928, 77 y.o.   MRN: [id number]  HPI 77 yo male with known hx of COPD, Asthma, hx of lung cancer s/p resection  August 06, 2009- COPD,asthma, bronchits, hx lung cancer/ bilateral lobectomies  Chronic cough has dried up in last 2 days with cooler weather. Was white- no green or blood, Denies fever, chest pain, sorethroat. Notes BP up, no weight loss, pain or fever.   November 04, 2009- COPD, asthma, bronchitis, hx lung ca/ bilateral lobectomies  Still sees Dr Susa Simmonds at [facility] once yearly for f/u lung ca. Was very clear last week with him, then 5 days later with no exposure got very congested without fever, green or sore throat. Now at baseline, denying productive cough. Admitting ankle edema. Elevates HOB, little nocturia.   January 17, 2010- COPD, asthma, bronchitis, hx lung cancer/ bilateral lobectomies  Got through winter ok. Productive cough is variable with white, nonbloody phlegm. Dyspnea with exertion seems to slowly porogress. Had scheduled f/u at [facility] for his hx of lung cancer and they asked he remember to get f/u CXR here.  Dr Deborah Chalk asked he continue diuretic script that we wrote.   May 16, 2010- COPD, asthma, bronchitis, hx lung cancer/ bilateral lobectomies  He continues to go to [facility] twice yearly. He was switched to [employer] which does about as much good as his Combivent did. Uses nebulizer only about once a month. Has some daily chest rattle and a bit of wheeze, dyspneic with any brisk or sustained walk. He doesn't feel there has been overall change in symptoms or exercise tolerance.   06/19/2011 Acute walk in  Pt presents for an acute office visit. Complains of cough w/ white phlem, wheezing, chest tightness, chest congestion, increase SOB for 4 days . Had severe wheezing for last 2 days. Lots of white thick mucus. OTC not helping. Nebs are helping some  Of note he is on ACE . No hemoptysis or weight loss.    Past Medical  History:  Last updated: 12/13/2007  COUGH (ICD-786.2)  Hx of CARCINOMA, LUNG (ICD-162.9)  BRONCHITIS, ACUTE (ICD-466.0)  GERD (ICD-530.81)  HYPERCHOLESTEROLEMIA (ICD-272.0)  HYPERTENSION, BENIGN (ICD-401.1)  ASTHMA (ICD-493.90)  COPD (ICD-496)   Past Surgical History:  Left lower lobectomy for cancer 1997  RULL lung nodule removed for cancer 2002   Family History:  negative for respiratory disease  Heart Disease- Father  Bladder CA- Mother   Social History:  Patient states former smoker. Quit in 1980. Smoked 2 ppd x 20 years.  No ETOH  Widowed  Children  Son lives with pt.  Retired    Review of Systems Constitutional:   No  weight loss, night sweats,  Fevers, chills,  +fatigue, or  lassitude.  HEENT:   No headaches,  Difficulty swallowing,  Tooth/dental problems, or  Sore throat,                No sneezing, itching, ear ache, nasal congestion, post nasal drip,   CV:  No chest pain,  Orthopnea, PND, swelling in lower extremities, anasarca, dizziness, palpitations, syncope.   GI  No heartburn, indigestion, abdominal pain, nausea, vomiting, diarrhea, change in bowel habits, loss of appetite, bloody stools.   Resp:    ,  No coughing up of blood.   No chest wall deformity  Skin: no rash or lesions.  GU: no dysuria, change in color of urine, no urgency or frequency.  No flank pain,  no hematuria   MS:  No joint pain or swelling.  No decreased range of motion.  No back pain.  Psych:  No change in mood or affect. No depression or anxiety.  No memory loss.         Objective:   Physical Exam GEN: A/Ox3; pleasant , NAD, elderly  HEENT:  Amo/AT,  EACs-clear, TMs-wnl, NOSE-clear, THROAT-clear, no lesions, no postnasal drip or exudate noted.   NECK:  Supple w/ fair ROM; no JVD; normal carotid impulses w/o bruits; no thyromegaly or nodules palpated; no lymphadenopathy.  RESP  Coarse BS   P & A; with few exp wheezes no accessory muscle use, no dullness to  percussion  CARD:  RRR, no m/r/g  , no peripheral edema, pulses intact, no cyanosis or clubbing.  GI:   Soft & nt; nml bowel sounds; no organomegaly or masses detected.  Musco: Warm bil, no deformities or joint swelling noted.   Neuro: alert, no focal deficits noted.    Skin: Warm, no lesions or rashes         Assessment & Plan:

## 2011-06-19 NOTE — Patient Instructions (Signed)
Augmentin 875mg  Twice daily  For 7 days  Mucinex DM Twice daily  As needed  Cough/congesiton  Fluids and rest  Prednisone taper over next week.  Please contact office for sooner follow up if symptoms do not improve or worsen or seek emergency care  follow up Dr. Maple Hudson  In 6 weeks  I will call with xray results.

## 2011-06-19 NOTE — Progress Notes (Signed)
Addended by: Tommie Sams on: 06/19/2011 12:00 PM   Modules accepted: Orders

## 2011-06-19 NOTE — Progress Notes (Signed)
Addended by: Tommie Sams on: 06/19/2011 11:30 AM   Modules accepted: Orders

## 2011-08-03 ENCOUNTER — Ambulatory Visit: Payer: Medicare Other | Admitting: Internal Medicine

## 2011-09-06 ENCOUNTER — Ambulatory Visit: Payer: Medicare Other | Admitting: Adult Health

## 2011-09-07 ENCOUNTER — Ambulatory Visit: Payer: Medicare Other | Admitting: Internal Medicine

## 2011-09-07 ENCOUNTER — Ambulatory Visit: Payer: Medicare Other | Admitting: Adult Health

## 2011-09-12 ENCOUNTER — Telehealth: Payer: Self-pay | Admitting: Internal Medicine

## 2011-09-12 NOTE — Telephone Encounter (Signed)
Reviewed images - compared with report of August, 2012

## 2011-09-12 NOTE — Telephone Encounter (Signed)
Disc and message on CY's cart to review and advise accordingly.

## 2011-09-15 ENCOUNTER — Telehealth: Payer: Self-pay | Admitting: Internal Medicine

## 2011-09-15 NOTE — Telephone Encounter (Signed)
Called and spoke with pt. Pt states he dropped of cxr images on a disc on Tuesday 09/12/11.  Pt wanting to know of the results and if CY compared them to pt's last cxr done in our system. Cy, please advise.  Thanks.   No Known Allergies

## 2011-09-15 NOTE — Telephone Encounter (Signed)
CXR- I have compared the images on the disk he brought from 09/06/11, with our last CXR from 06/19/11 and see no significant change- old postoperative changes and bilateral scarring with no new or active process seen.

## 2011-09-15 NOTE — Telephone Encounter (Signed)
CY has reviewed images.

## 2011-09-15 NOTE — Telephone Encounter (Signed)
Called and spoke with pt.  Pt aware of CY's response.  Nothing further needed.

## 2011-09-28 ENCOUNTER — Encounter: Payer: Self-pay | Admitting: Internal Medicine

## 2011-09-28 ENCOUNTER — Ambulatory Visit (INDEPENDENT_AMBULATORY_CARE_PROVIDER_SITE_OTHER): Payer: Medicare Other | Admitting: Internal Medicine

## 2011-09-28 VITALS — BP 120/82 | HR 67 | Ht 66.0 in | Wt 164.2 lb

## 2011-09-28 DIAGNOSIS — T783XXA Angioneurotic edema, initial encounter: Secondary | ICD-10-CM

## 2011-09-28 DIAGNOSIS — C3490 Malignant neoplasm of unspecified part of unspecified bronchus or lung: Secondary | ICD-10-CM

## 2011-09-28 DIAGNOSIS — J449 Chronic obstructive pulmonary disease, unspecified: Secondary | ICD-10-CM

## 2011-09-28 NOTE — Progress Notes (Signed)
Patient ID: Cesar Walker, male    DOB: [birth year], 77 y.o.   MRN: [id number]  HPI 77 yo male with known hx of COPD, Asthma, hx of lung cancer s/p resection  August 06, 2009- COPD,asthma, bronchits, hx lung cancer/ bilateral lobectomies  Chronic cough has dried up in last 2 days with cooler weather. Was white- no green or blood, Denies fever, chest pain, sorethroat. Notes BP up, no weight loss, pain or fever.   November 04, 2009- COPD, asthma, bronchitis, hx lung ca/ bilateral lobectomies  Still sees Dr Susa Simmonds at [facility] once yearly for f/u lung ca. Was very clear last week with him, then 5 days later with no exposure got very congested without fever, green or sore throat. Now at baseline, denying productive cough. Admitting ankle edema. Elevates HOB, little nocturia.   January 17, 2010- COPD, asthma, bronchitis, hx lung cancer/ bilateral lobectomies  Got through winter ok. Productive cough is variable with white, nonbloody phlegm. Dyspnea with exertion seems to slowly porogress. Had scheduled f/u at [facility] for his hx of lung cancer and they asked he remember to get f/u CXR here.  Dr Deborah Chalk asked he continue diuretic script that we wrote.   May 16, 2010- COPD, asthma, bronchitis, hx lung cancer/ bilateral lobectomies  He continues to go to [facility] yearly. He was switched to [employer] which does about as much good as his Combivent did. Uses nebulizer only about once a month. Has some daily chest rattle and a bit of wheeze, dyspneic with any brisk or sustained walk. He doesn't feel there has been overall change in symptoms or exercise tolerance.   06/19/2011 Acute walk in  Pt presents for an acute office visit. Complains of cough w/ white phlem, wheezing, chest tightness, chest congestion, increase SOB for 4 days . Had severe wheezing for last 2 days. Lots of white thick mucus. OTC not helping. Nebs are helping some  Of note he is on ACE . No hemoptysis or weight loss.   09/28/11-  77 yoM former smoker  followed for COPD, asthma, bronchitis, hx lung cancer/ bilateral lobectomies  Has had flu vaccine. Treated for bronchitis last month by his PCP. Chest x-ray then raised concern of possible enlarged lymph nodes. Cough is productive. He still has daily cough mostly a little bit of white sputum with no blood. There is point soreness with cough at the lower right anterior costal margin starting 2 weeks ago. His upper lip has been swelling off and on for a few days. No hives. This has happened several times before and is self limiting without progression. I discussed his lisinopril. He still chews tobacco. He still goes to [facility] twice a year for followup of his lung cancer. CXR- He brings a chest x-ray disc 09/06/2011/Dr. Avva. We reviewed the images together and then compared with CXR in our system from August of 2012 and March of 2011. There has been no significant change. Chronic prominence of the right hilum. Old surgical changes with extensive scarring and postthoracotomy rib changes. I don't see any active process more difference.   Review of Systems Constitutional:   No-   weight loss, night sweats, fevers, chills, fatigue, lassitude. HEENT:   No-  headaches, difficulty swallowing, tooth/dental problems, sore throat,       No-  sneezing, itching, ear ache, nasal congestion, post nasal drip,  CV:  + chest pain, No- orthopnea, PND, swelling in lower extremities, anasarca,  dizziness, palpitations Resp: No- acute  shortness of breath with exertion or at rest.              +   productive cough,  No non-productive cough,  No- coughing up of blood.              No-   change in color of mucus.  No- wheezing.   Skin: No-   rash or lesions. GI:  No-   heartburn, indigestion, abdominal pain, nausea, vomiting, diarrhea,                 change in bowel habits, loss of appetite GU: No-   dysuria, change in color of urine, no urgency or frequency.  No- flank pain. MS:  No-    joint pain or swelling.  No- decreased range of motion.  No- back pain. Neuro-     nothing unusual Psych:  No- change in mood or affect. No depression or anxiety.  No memory loss.     Objective:   Physical Exam General- Alert, Oriented, Affect-appropriate, Distress- none acute Skin- rash-none, lesions- none, excoriation- none Lymphadenopathy- none Head- atraumatic            Eyes- Gross vision intact, PERRLA, conjunctivae clear secretions            Ears- Hearing, canals-normal            Nose- Clear, no-Septal dev, mucus, polyps, erosion, perforation             Throat- Mallampati II , mucosa clear , drainage- none, tonsils- atrophic Neck- flexible , trachea midline, no stridor , thyroid nl, carotid no bruit Chest - symmetrical excursion , unlabored           Heart/CV- RRR , no murmur , no gallop  , no rub, nl s1 s2                           - JVD- none , edema- none, stasis changes- none, varices- none           Lung- clear to P&A-distant, wheeze- none, cough- none , dullness-none, rub- none           Chest wall- tender to point pressure where he indicates on the right lower anterior costal margin. Old thoracotomy scar left lateral chest. Abd- tender-no, distended-no, bowel sounds-present, HSM- no Br/ Gen/ Rectal- Not done, not indicated Extrem- cyanosis- none, clubbing, none, atrophy- none, strength- nl Neuro- grossly intact to observation. There is mild asymmetry across his upper lip, a little fuller or a little bit more droopy on the left. Smile is symmetrical.

## 2011-09-28 NOTE — Patient Instructions (Addendum)
Lisinopril can sometimes cause swelling lips "angioedema". You can ask Dr Felipa Eth about stopping that medicine to see. You can also try taking an antihistamine, like claritin, zyrtec or allegra when your lip swells to see if that helps.   Your chest xrays look stable.

## 2011-10-01 NOTE — Assessment & Plan Note (Signed)
He is followed at [facility] twice a year.

## 2011-10-01 NOTE — Assessment & Plan Note (Signed)
Angioedema of the lip has been coming episodically for quite a while. This may be from his lisinopril, probably acting as an aggravating factor for another trigger. He can try antihistamines short-term but should talk with Dr.Avva about changing to a different medication.

## 2011-10-01 NOTE — Assessment & Plan Note (Signed)
Severe COPD, now returning to baseline after recent exacerbation. Upon comparing available x-rays, I don't think there has been change in the prominent right hilum going back to 2011 so this is probably scarring.

## 2011-10-13 ENCOUNTER — Other Ambulatory Visit: Payer: Self-pay | Admitting: Dermatology

## 2011-11-02 ENCOUNTER — Ambulatory Visit (INDEPENDENT_AMBULATORY_CARE_PROVIDER_SITE_OTHER)
Admission: RE | Admit: 2011-11-02 | Discharge: 2011-11-02 | Disposition: A | Discharge status: Home / Self Care | Payer: Medicare Other | Source: Ambulatory Visit | Attending: Adult Health | Admitting: Adult Health

## 2011-11-02 ENCOUNTER — Encounter: Payer: Self-pay | Admitting: Adult Health

## 2011-11-02 ENCOUNTER — Telehealth: Payer: Self-pay | Admitting: Internal Medicine

## 2011-11-02 ENCOUNTER — Ambulatory Visit (INDEPENDENT_AMBULATORY_CARE_PROVIDER_SITE_OTHER): Payer: Medicare Other | Admitting: Adult Health

## 2011-11-02 VITALS — BP 142/76 | HR 84 | Temp 97.3°F | Ht 66.0 in | Wt 159.6 lb

## 2011-11-02 DIAGNOSIS — J449 Chronic obstructive pulmonary disease, unspecified: Secondary | ICD-10-CM

## 2011-11-02 MED ORDER — LEVALBUTEROL HCL 0.63 MG/3ML IN NEBU
0.6300 mg | INHALATION_SOLUTION | Freq: Once | RESPIRATORY_TRACT | Status: AC
  Administered 2011-11-02: 0.63 mg via RESPIRATORY_TRACT

## 2011-11-02 MED ORDER — METHYLPREDNISOLONE ACETATE 80 MG/ML IJ SUSP: 120.0000 mg | Freq: Once | INTRAMUSCULAR | Status: DC

## 2011-11-02 MED ORDER — PREDNISONE 10 MG PO TABS: ORAL_TABLET | ORAL | Status: DC

## 2011-11-02 NOTE — Progress Notes (Signed)
Patient ID: Cesar Walker, male    DOB: 06-18-1928, 78 y.o.   MRN: [id number]  HPI 78 yo male with known hx of COPD, Asthma, hx of lung cancer s/p resection  August 06, 2009- COPD,asthma, bronchits, hx lung cancer/ bilateral lobectomies  Chronic cough has dried up in last 2 days with cooler weather. Was white- no green or blood, Denies fever, chest pain, sorethroat. Notes BP up, no weight loss, pain or fever.   November 04, 2009- COPD, asthma, bronchitis, hx lung ca/ bilateral lobectomies  Still sees Dr Susa Simmonds at [facility] once yearly for f/u lung ca. Was very clear last week with him, then 5 days later with no exposure got very congested without fever, green or sore throat. Now at baseline, denying productive cough. Admitting ankle edema. Elevates HOB, little nocturia.   January 17, 2010- COPD, asthma, bronchitis, hx lung cancer/ bilateral lobectomies  Got through winter ok. Productive cough is variable with white, nonbloody phlegm. Dyspnea with exertion seems to slowly porogress. Had scheduled f/u at [facility] for his hx of lung cancer and they asked he remember to get f/u CXR here.  Dr Deborah Chalk asked he continue diuretic script that we wrote.   May 16, 2010- COPD, asthma, bronchitis, hx lung cancer/ bilateral lobectomies  He continues to [facility] yearly. He was switched to [employer] which does about as much good as his Combivent did. Uses nebulizer only about once a month. Has some daily chest rattle and a bit of wheeze, dyspneic with any brisk or sustained walk. He doesn't feel there has been overall change in symptoms or exercise tolerance.   06/19/2011 Acute walk in  Pt presents for an acute office visit. Complains of cough w/ white phlem, wheezing, chest tightness, chest congestion, increase SOB for 4 days . Had severe wheezing for last 2 days. Lots of white thick mucus. OTC not helping. Nebs are helping some  Of note he is on ACE . No hemoptysis or weight loss.   09/28/11-  83 yoM former smoker  followed for COPD, asthma, bronchitis, hx lung cancer/ bilateral lobectomies  Has had flu vaccine. Treated for bronchitis last month by his PCP. Chest x-ray then raised concern of possible enlarged lymph nodes. Cough is productive. He still has daily cough mostly a little bit of white sputum with no blood. There is point soreness with cough at the lower right anterior costal margin starting 2 weeks ago. His upper lip has been swelling off and on for a few days. No hives. This has happened several times before and is self limiting without progression. I discussed his lisinopril. He still chews tobacco. He still goes to [facility] twice a year for followup of his lung cancer. CXR- He brings a chest x-ray disc 09/06/2011/Dr. Avva. We reviewed the images together and then compared with CXR in our system from August of 2012 and March of 2011. There has been no significant change. Chronic prominence of the right hilum. Old surgical changes with extensive scarring and postthoracotomy rib changes. I don't see any active process more difference. >>  11/02/2011 Acute OV  Complains of persistent cough  with white mucus, increased SOB, wheezing, worse at night w/ lying down x9days. Seen by PCP and  was given augmentin on 1.2.13 and pred taper by PCP.  Some better but cough and wheezing will not stop. OTC not helping.  Very congested esp at night. No edema. Is now off ACE inhibitor x 1 month No hemoptysis or  chest pain.    Review of Systems Constitutional:   No  weight loss, night sweats,  Fevers, chills, fatigue, or  lassitude.  HEENT:   No headaches,  Difficulty swallowing,  Tooth/dental problems, or  Sore throat,                No sneezing, itching, ear ache, nasal congestion, post nasal drip,   CV:  No chest pain,  Orthopnea, PND, swelling in lower extremities, anasarca, dizziness, palpitations, syncope.   GI  No heartburn, indigestion, abdominal pain, nausea, vomiting, diarrhea, change in bowel habits, loss of  appetite, bloody stools.   Resp:   No coughing up of blood.     No chest wall deformity  Skin: no rash or lesions.  GU: no dysuria, change in color of urine, no urgency or frequency.  No flank pain, no hematuria   MS:  No joint pain or swelling.  No decreased range of motion.  No back pain.  Psych:  No change in mood or affect. No depression or anxiety.  No memory loss.         Objective:   Physical Exam GEN: A/Ox3; pleasant , NAD,  Elderly   HEENT:  [place]/AT,  EACs-clear, TMs-wnl, NOSE-clear, THROAT-clear, no lesions, no postnasal drip or exudate noted.   NECK:  Supple w/ fair ROM; no JVD; normal carotid impulses w/o bruits; no thyromegaly or nodules palpated; no lymphadenopathy.  RESP  Coarse BS w/ exp wheezing  no accessory muscle use, no dullness to percussion  CARD:  RRR, no m/r/g  , no peripheral edema, pulses intact, no cyanosis or clubbing.  GI:   Soft & nt; nml bowel sounds; no organomegaly or masses detected.  Musco: Warm bil, no deformities or joint swelling noted.   Neuro: alert, no focal deficits noted.    Skin: Warm, no lesions or rashes

## 2011-11-02 NOTE — Progress Notes (Signed)
Addended by: Boone Master E on: 11/02/2011 05:05 PM   Modules accepted: Orders

## 2011-11-02 NOTE — Assessment & Plan Note (Addendum)
Slow to resolve exacerbation  cxr today with no acute process xopenex neb in office  Depo medrol 120mg  IM x 1 in office  Plan:  Finish Augmentin  Prednisone taper over next week.  Mucinex DM Twice daily As needed  Cough/congestion  Fluids and rest  Please contact office for sooner follow up if symptoms do not improve or worsen or seek emergency care  follow up Dr. Maple Hudson  In 2 weeks and As needed

## 2011-11-02 NOTE — Telephone Encounter (Signed)
Per CY-okay to let patient see TP today-per JJ okay to put patient in 4pm slot with TP today. Pt is aware of appt.

## 2011-11-02 NOTE — Progress Notes (Signed)
Addended by: Alecia Lemming on: 11/02/2011 04:43 PM   Modules accepted: Orders

## 2011-11-02 NOTE — Telephone Encounter (Signed)
Called spoke with patient who c/o increased SOB, rattling in chest, prod cough with white mucus, some head congestion, no mucus production x9days - saw PCP on 1.2.13 and was given amoxtr-kclv 125mg  #20, 1 po BID.  Still taking this.  Reports feeling "some better."  Denies f/c/s.  Requesting appt.  Last ov 12.6.12, next 6.6.13.  States is okay to see TP is CDY is okay with this as well.  [facility] [address]  No Known Allergies  Dr Maple Hudson please advise, thanks.

## 2011-12-14 ENCOUNTER — Telehealth: Payer: Self-pay | Admitting: Internal Medicine

## 2011-12-14 DIAGNOSIS — J449 Chronic obstructive pulmonary disease, unspecified: Secondary | ICD-10-CM

## 2011-12-14 NOTE — Telephone Encounter (Signed)
Called and spoke with pt. Pt aware order sent to [facility] for pulm rehab.

## 2011-12-14 NOTE — Telephone Encounter (Signed)
Error- ignore order for CPAP adjustment- wrong patient.   Order- [facility]- refer to [facility] for Dx Gold III COPD

## 2011-12-14 NOTE — Telephone Encounter (Signed)
Spoke with patient-states that he was told by [facility] dr that he should be in a pulmonary rehab program locally and pt would like referral from [facility] for this. CY Please advise if you are okay with referral based on DX of COPD. Thanks.

## 2011-12-14 NOTE — Telephone Encounter (Signed)
Order- [facility]- reduce CPAP AutoSet range to 5-10 cwp per patient complaint that current pressure is too high.      Dx OSA

## 2012-01-01 ENCOUNTER — Ambulatory Visit (INDEPENDENT_AMBULATORY_CARE_PROVIDER_SITE_OTHER): Payer: Medicare Other | Admitting: Adult Health

## 2012-01-01 ENCOUNTER — Encounter: Payer: Self-pay | Admitting: Adult Health

## 2012-01-01 VITALS — BP 136/66 | HR 87 | Temp 98.2°F | Ht 66.0 in | Wt 162.8 lb

## 2012-01-01 DIAGNOSIS — J449 Chronic obstructive pulmonary disease, unspecified: Secondary | ICD-10-CM

## 2012-01-01 MED ORDER — PREDNISONE 10 MG PO TABS: ORAL_TABLET | ORAL | Status: DC

## 2012-01-01 MED ORDER — DOXYCYCLINE HYCLATE 100 MG PO TABS: 100.0000 mg | ORAL_TABLET | Freq: Two times a day (BID) | ORAL | Status: AC

## 2012-01-01 NOTE — Progress Notes (Signed)
Patient ID: Cesar Walker, male    DOB: 31-Mar-1928, 78 y.o.   MRN: [id number]  HPI 78 yo male with known hx of COPD, Asthma, hx of lung cancer s/p resection  August 06, 2009- COPD,asthma, bronchits, hx lung cancer/ bilateral lobectomies  Chronic cough has dried up in last 2 days with cooler weather. Was white- no green or blood, Denies fever, chest pain, sorethroat. Notes BP up, no weight loss, pain or fever.   November 04, 2009- COPD, asthma, bronchitis, hx lung ca/ bilateral lobectomies  Still sees Dr Susa Simmonds at [facility] once yearly for f/u lung ca. Was very clear last week with him, then 5 days later with no exposure got very congested without fever, green or sore throat. Now at baseline, denying productive cough. Admitting ankle edema. Elevates HOB, little nocturia.   January 17, 2010- COPD, asthma, bronchitis, hx lung cancer/ bilateral lobectomies  Got through winter ok. Productive cough is variable with white, nonbloody phlegm. Dyspnea with exertion seems to slowly porogress. Had scheduled f/u at [facility] for his hx of lung cancer and they asked he remember to get f/u CXR here.  Dr Deborah Chalk asked he continue diuretic script that we wrote.   May 16, 2010- COPD, asthma, bronchitis, hx lung cancer/ bilateral lobectomies  He continues to go to [facility] twice yearly. He was switched to [employer] which does about as much good as his Combivent did. Uses nebulizer only about once a month. Has some daily chest rattle and a bit of wheeze, dyspneic with any brisk or sustained walk. He doesn't feel there has been overall change in symptoms or exercise tolerance.   06/19/2011 Acute walk in  Pt presents for an acute office visit. Complains of cough w/ white phlem, wheezing, chest tightness, chest congestion, increase SOB for 4 days . Had severe wheezing for last 2 days. Lots of white thick mucus. OTC not helping. Nebs are helping some  Of note he is on ACE . No hemoptysis or weight loss.   09/28/11-  83 yoM former smoker  followed for COPD, asthma, bronchitis, hx lung cancer/ bilateral lobectomies  Has had flu vaccine. Treated for bronchitis last month by his PCP. Chest x-ray then raised concern of possible enlarged lymph nodes. Cough is productive. He still has daily cough mostly a little bit of white sputum with no blood. There is point soreness with cough at the lower right anterior costal margin starting 2 weeks ago. His upper lip has been swelling off and on for a few days. No hives. This has happened several times before and is self limiting without progression. I discussed his lisinopril. He still chews tobacco. He still goes to [facility] twice a year for followup of his lung cancer. CXR- He brings a chest x-ray disc 09/06/2011/Dr. Avva. We reviewed the images together and then compared with CXR in our system from August of 2012 and March of 2011. There has been no significant change. Chronic prominence of the right hilum. Old surgical changes with extensive scarring and postthoracotomy rib changes. I don't see any active process more difference. >>  11/02/2011 Acute OV  Complains of persistent cough  with white mucus, increased SOB, wheezing, worse at night w/ lying down x9days. Seen by PCP and  was given augmentin on 1.2.13 and pred taper by PCP.  Some better but cough and wheezing will not stop. OTC not helping.  Very congested esp at night. No edema. Is now off ACE inhibitor x 1 month No hemoptysis or  chest pain.   01/01/2012 Acute OV  Complains of wheezing, prod cough with white mucus in 2 weeks . He has upcoming  surgery done on eyelids next week. OTC not working. Cough and wheezing are getting worse.  He denies any hemoptysis, chest pain, orthopnea, or leg swelling. He has no fever.   Review of Systems Constitutional:   No  weight loss, night sweats,  Fevers, chills,  +fatigue, or  lassitude.  HEENT:   No headaches,  Difficulty swallowing,  Tooth/dental problems, or  Sore throat,                No  sneezing, itching, ear ache,  +nasal congestion, post nasal drip,   CV:  No chest pain,  Orthopnea, PND, swelling in lower extremities, anasarca, dizziness, palpitations, syncope.   GI  No heartburn, indigestion, abdominal pain, nausea, vomiting, diarrhea, change in bowel habits, loss of appetite, bloody stools.   Resp:   No coughing up of blood.     No chest wall deformity  Skin: no rash or lesions.  GU: no dysuria, change in color of urine, no urgency or frequency.  No flank pain, no hematuria   MS:  No joint pain or swelling.  No decreased range of motion.  No back pain.  Psych:  No change in mood or affect. No depression or anxiety.  No memory loss.         Objective:   Physical Exam GEN: A/Ox3; pleasant , NAD,  Elderly   HEENT:  [place]/AT,  EACs-clear, TMs-wnl, NOSE-clear, THROAT-clear, no lesions, no postnasal drip or exudate noted.   NECK:  Supple w/ fair ROM; no JVD; normal carotid impulses w/o bruits; no thyromegaly or nodules palpated; no lymphadenopathy.  RESP  Coarse BS w/ exp wheezing  no accessory muscle use, no dullness to percussion  CARD:  RRR, no m/r/g  , no peripheral edema, pulses intact, no cyanosis or clubbing.  GI:   Soft & nt; nml bowel sounds; no organomegaly or masses detected.  Musco: Warm bil, no deformities or joint swelling noted.   Neuro: alert, no focal deficits noted.    Skin: Warm, no lesions or rashes

## 2012-01-01 NOTE — Patient Instructions (Signed)
Doxycycline 100mg  Twice daily  For 7 days  Mucinex DM Twice daily  As needed  Cough/congesiton  Fluids and rest  Prednisone taper over next week.  Please contact office for sooner follow up if symptoms do not improve or worsen or seek emergency care  Follow up Dr. Maple Hudson  In 3 months

## 2012-01-01 NOTE — Assessment & Plan Note (Signed)
Exacerbation   Plan:  Doxycycline 100mg  Twice daily  For 7 days  Mucinex DM Twice daily  As needed  Cough/congesiton  Fluids and rest  Prednisone taper over next week.  Please contact office for sooner follow up if symptoms do not improve or worsen or seek emergency care  Follow up Dr. Maple Hudson  In 3 months

## 2012-01-15 ENCOUNTER — Encounter: Payer: Self-pay | Admitting: *Deleted

## 2012-01-16 ENCOUNTER — Telehealth: Payer: Self-pay | Admitting: Internal Medicine

## 2012-01-16 ENCOUNTER — Ambulatory Visit (INDEPENDENT_AMBULATORY_CARE_PROVIDER_SITE_OTHER): Payer: Medicare Other | Admitting: Nurse Practitioner

## 2012-01-16 ENCOUNTER — Encounter: Payer: Self-pay | Admitting: Nurse Practitioner

## 2012-01-16 VITALS — BP 146/80 | HR 84 | Ht 66.0 in | Wt 163.0 lb

## 2012-01-16 DIAGNOSIS — R0609 Other forms of dyspnea: Secondary | ICD-10-CM

## 2012-01-16 DIAGNOSIS — J449 Chronic obstructive pulmonary disease, unspecified: Secondary | ICD-10-CM

## 2012-01-16 DIAGNOSIS — I10 Essential (primary) hypertension: Secondary | ICD-10-CM

## 2012-01-16 DIAGNOSIS — J4489 Other specified chronic obstructive pulmonary disease: Secondary | ICD-10-CM

## 2012-01-16 DIAGNOSIS — R0600 Dyspnea, unspecified: Secondary | ICD-10-CM

## 2012-01-16 DIAGNOSIS — T783XXA Angioneurotic edema, initial encounter: Secondary | ICD-10-CM

## 2012-01-16 DIAGNOSIS — E785 Hyperlipidemia, unspecified: Secondary | ICD-10-CM

## 2012-01-16 LAB — BASIC METABOLIC PANEL
BUN: 17 mg/dL (ref 6–23)
CO2: 24 mEq/L (ref 19–32)
Calcium: 9.8 mg/dL (ref 8.4–10.5)
Chloride: 105 mEq/L (ref 96–112)
Creatinine, Ser: 0.7 mg/dL (ref 0.4–1.5)
GFR: 122.81 mL/min (ref >60.00)
Glucose, Bld: 80 mg/dL (ref 70–99)
Potassium: 4 mEq/L (ref 3.5–5.1)
Sodium: 138 mEq/L (ref 135–145)

## 2012-01-16 LAB — HEPATIC FUNCTION PANEL
ALT: 22 U/L (ref 0–53)
AST: 19 U/L (ref 0–37)
Albumin: 4.3 g/dL (ref 3.5–5.2)
Alkaline Phosphatase: 91 U/L (ref 39–117)
Bilirubin, Direct: 0 mg/dL (ref 0.0–0.3)
Total Bilirubin: 0.7 mg/dL (ref 0.3–1.2)
Total Protein: 7.4 g/dL (ref 6.0–8.3)

## 2012-01-16 LAB — CBC WITH DIFFERENTIAL/PLATELET
Basophils Absolute: 0 10*3/uL (ref 0.0–0.1)
Basophils Relative: 0.2 % (ref 0.0–3.0)
Eosinophils Absolute: 0.4 10*3/uL (ref 0.0–0.7)
Eosinophils Relative: 3.7 % (ref 0.0–5.0)
HCT: 51.6 % (ref 39.0–52.0)
Hemoglobin: 17.4 g/dL — ABNORMAL HIGH (ref 13.0–17.0)
Lymphocytes Relative: 14 % (ref 12.0–46.0)
Lymphs Abs: 1.5 10*3/uL (ref 0.7–4.0)
MCHC: 33.6 g/dL (ref 30.0–36.0)
MCV: 90.3 fl (ref 78.0–100.0)
Monocytes Absolute: 1.3 10*3/uL — ABNORMAL HIGH (ref 0.1–1.0)
Monocytes Relative: 12.7 % — ABNORMAL HIGH (ref 3.0–12.0)
Neutro Abs: 7.4 10*3/uL (ref 1.4–7.7)
Neutrophils Relative %: 69.4 % (ref 43.0–77.0)
Platelets: 261 10*3/uL (ref 150.0–400.0)
RBC: 5.72 Mil/uL (ref 4.22–5.81)
RDW: 14.1 % (ref 11.5–14.6)
WBC: 10.6 10*3/uL — ABNORMAL HIGH (ref 4.5–10.5)

## 2012-01-16 LAB — LIPID PANEL
Cholesterol: 209 mg/dL — ABNORMAL HIGH (ref 0–200)
HDL: 57.2 mg/dL (ref >39.00)
Total CHOL/HDL Ratio: 4
Triglycerides: 114 mg/dL (ref 0.0–149.0)
VLDL: 22.8 mg/dL (ref 0.0–40.0)

## 2012-01-16 LAB — LDL CHOLESTEROL, DIRECT: Direct LDL: 149 mg/dL

## 2012-01-16 NOTE — Progress Notes (Signed)
Cesar Walker Date of Birth: [birth year] Medical Record #[id number]  History of Present Illness: Cesar Walker is seen today for a work in visit. He is seen for Dr. Jens Som. He is a former patient of Dr. Ronnald Nian. He does not have any known CAD, but has HTN and HLD. He has chronic issues with his lungs and has had prior resection. He saw Dr. Jens Som back in June of last year. At that time, it looks like he was taking his ACE.   He comes in today. He is here alone. He has had a recent "lung infection". Has been a round of antibiotics and prednisone. He notes that he then started having trouble with his blood pressure. He has been off of his Lisinopril for some times. Not clear as to why. Blood pressure was 160/90 and is now back down to 126/69. He restarted the Lisinopril this past Sunday. He is not having any trouble with it at this time, but in the past has complained of his lips swelling. He currently denies this at this time. He says he feels good in general. Does note some dizziness with walking and feels like he staggers. Does not use his oxygen. Still wheezing from his recent bronchitis. No chest pain reported.   Current Outpatient Prescriptions on File Prior to Visit  Medication Sig Dispense Refill  . albuterol (PROVENTIL HFA;VENTOLIN HFA) 108 (90 BASE) MCG/ACT inhaler Inhale 2 puffs into the lungs 3 (three) times daily.       Marland Kitchen albuterol (PROVENTIL) (2.5 MG/3ML) 0.083% nebulizer solution Take 2.5 mg by nebulization every 4 (four) hours as needed.        Marland Kitchen amLODipine (NORVASC) 10 MG tablet Take 10 mg by mouth daily.        Marland Kitchen aspirin (BAYER ASPIRIN EC LOW DOSE) 81 MG EC tablet Take 81 mg by mouth daily.         Current Facility-Administered Medications on File Prior to Visit  Medication Dose Route Frequency Provider Last Rate Last Dose  . methylPREDNISolone acetate (DEPO-MEDROL) injection 120 mg  120 mg Intramuscular Once Tammy Rogers [employer], NP        No Known Allergies  Past Medical History    Diagnosis Date  . Carcinoma of lung     S/P resection  . GERD (gastroesophageal reflux disease)   . Hypercholesteremia   . HTN (hypertension), benign     s/p echo in 2009 showing EF of 55 to 60% with mild LVH and impaired diastolic relaxation  . Asthma   . COPD (chronic obstructive pulmonary disease)   . Anxiety   . Normal nuclear stress test 2009    EF of 72% with no ischemia or infarction    Past Surgical History  Procedure Date  . Left lower lobectomy for cancer 1997  . Rull lung nodule removed for cancer 2002  . US echocardiography 11/2007    EF 55-60% WITH MILD LVH, IMPAIRED DIASTOLIC RELAXATION  . US echocardiography 01/18/2005    EF 55-60%  . Cardiovascular stress test 06/23/2005    EF 70%, NO EVIDENCE OF ISCHEMIA    History  Smoking status  . Former Smoker -- 2.0 packs/day for 20 years  . Types: Cigarettes  . Quit date: 10/23/1978  Smokeless tobacco  . Not on file    History  Alcohol Use No    Family History  Problem Relation Age of Onset  . Heart disease Father   . Cancer Mother     bladder  .  Heart disease Maternal Grandfather     Review of Systems: The review of systems is positive for wheezing. Blood pressure has normalized with resumption of his medicines. He has had no recent labs.  All other systems were reviewed and are negative.  Physical Exam: BP 146/80  Pulse 84  Ht 5\' 6"  (1.676 m)  Wt 163 lb (73.936 kg)  BMI 26.31 kg/m2  SpO2 96% Oxygen sat was down to 90% with walking.  Patient is alert and in no acute distress. Skin is warm and dry. Color is normal. Face is ruddy.  HEENT is unremarkable. Normocephalic/atraumatic. PERRL. Sclera are nonicteric. Neck is supple. No masses. No JVD. Lungs show diffuse wheezing. Cardiac exam shows a regular rate and rhythm. Abdomen is soft. Extremities are without edema. Gait and ROM are intact. No gross neurologic deficits noted.  LABORATORY DATA: PENDING  Assessment / Plan:

## 2012-01-16 NOTE — Assessment & Plan Note (Signed)
He is back on his Lisinopril. Blood pressure has improved. No side effects reported at this time. He would like to stay on this current regimen. We will check his labs today. Will plan to see him at his regular follow up in June. Patient is agreeable to this plan and will call if any problems develop in the interim.

## 2012-01-16 NOTE — Patient Instructions (Signed)
We will call pulmonary rehab. You have an order to start. We will just let them know you can go.  Stay on your current medicines.  Monitor your blood pressure at home.  We are checking some labs today.  I will have Dr. Jens Som see you in June.  Call the [facility] office at [phone] if you have any questions, problems or concerns.

## 2012-01-16 NOTE — Assessment & Plan Note (Signed)
Does have issues with his COPD. Has been referred to pulmonary rehab but has not heard of a start date. Will ask pulmonary to call and arrange.

## 2012-01-16 NOTE — Telephone Encounter (Signed)
Order was placed for pulmonary rehab on 12/14/11 but it can take up to 5 weeks sometimes for the initial appt. I have spoke with Liborio Nixon at rehab, [phone], and she will get Cesar Walker to check on this an give a call back to triage.

## 2012-01-16 NOTE — Assessment & Plan Note (Signed)
Currently without symptoms. He does not wish to change his medicines. I have asked him to call if he has any problems.

## 2012-01-17 ENCOUNTER — Telehealth: Payer: Self-pay | Admitting: *Deleted

## 2012-01-17 DIAGNOSIS — E7800 Pure hypercholesterolemia, unspecified: Secondary | ICD-10-CM

## 2012-01-17 MED ORDER — PRAVASTATIN SODIUM 20 MG PO TABS: 20.0000 mg | ORAL_TABLET | Freq: Every evening | ORAL | Status: DC

## 2012-01-17 NOTE — Telephone Encounter (Signed)
Call Pulmonary Rehab to check on the status of this.  Spoke with Liborio Nixon who states she has given this information to [facility] who is working on this.  Will await call back from [facility]

## 2012-01-17 NOTE — Telephone Encounter (Signed)
Boneta Lucks w/ Rehab called.  Would like to discuss Mr. Uphoff w/ Dr. Maple Hudson.  [name]

## 2012-01-17 NOTE — Telephone Encounter (Signed)
Spoke with pt, HE AGREED TO START PRAVACHOL. HE ALSO ASK FOR SOMETHING FOR HIS CONGESTION. DISCUSS WITH Norma Fredrickson NP, PT NEEDS TO CONTACT PULMONARY. HE JUST HAD A ROUND OF ANTIBIOTICS AND PREDNISONE. PT AGREED

## 2012-01-17 NOTE — Telephone Encounter (Signed)
Message copied by Freddi Starr on Wed Jan 17, 2012  9:41 AM ------      Message from: Rosalio Macadamia      Created: Wed Jan 17, 2012  7:38 AM       Ok to report. Labs are satisfactory. White count is up slightly but has just been treated for bronchitis/lung infection. Lipids not so good. Would he consider statin therapy? If agrees, Pravachol 20 mg daily with repeat labs in 6 weeks (HPF/lipids).

## 2012-01-18 NOTE — Telephone Encounter (Signed)
Cesar Walker called from cardio to check on this again.  Please call back w/ an update.  [name]

## 2012-01-18 NOTE — Telephone Encounter (Signed)
Spoke with phyllis a pulmonary rehab and they do have the referral for the pt and are working on it, but Boneta Lucks, Interior and spatial designer of pulm rehab wants to speak with Dr. Maple Hudson about this pt because he has been in the program in the past and there were some issues that she would like to discuss with Dr. Maple Hudson prior to getting the pt scheduled for rehab. She is not in the office today, but will return on Monday. I will forward message to Dr. Maple Hudson so he is aware she will be calling him to discuss the patient. Carron Curie, CMA

## 2012-01-18 NOTE — Telephone Encounter (Signed)
I spoke with [place] and advised that rehab has order and it takes 6-8 weeks at this time to get initial appt and they will contact the pt for appt.   I also LMTCBx1 with andrea at rehab to see what is needed from them. Carron Curie, CMA

## 2012-01-22 ENCOUNTER — Telehealth: Payer: Self-pay | Admitting: Internal Medicine

## 2012-01-22 MED ORDER — AZITHROMYCIN 250 MG PO TABS: ORAL_TABLET | ORAL | Status: AC

## 2012-01-22 NOTE — Telephone Encounter (Signed)
Per CY-long waiting list no room at [facility]; He would be happy to direct to [employer] [facility].

## 2012-01-22 NOTE — Telephone Encounter (Signed)
Spoke with pt and notified of recs per CDY. He states he prefers to just wait for rehab at [facility] he is aware that there is a long wait that is at least 6 to 8 wks. Will forward back to CDY so he is aware.

## 2012-01-22 NOTE — Telephone Encounter (Signed)
Per Cy-okay to give Zpak #1 take as directed no refills.  

## 2012-01-22 NOTE — Telephone Encounter (Signed)
Spoke with pt.  He is c/o prod cough with large amounts of white to green sputum x 4 days. He also c/o wheezing and chest tightness.  No CP, fever.  I advised no openings with CDY this wk nor TP until 4/3. Please advise, thanks! No Known Allergies

## 2012-01-22 NOTE — Telephone Encounter (Signed)
I spoke with pt and is aware of CDY recs. He voiced his understanding and had no questions, rx has been sent

## 2012-01-23 NOTE — Telephone Encounter (Signed)
Noted  

## 2012-01-30 ENCOUNTER — Other Ambulatory Visit: Payer: Self-pay | Admitting: Nurse Practitioner

## 2012-01-30 DIAGNOSIS — G459 Transient cerebral ischemic attack, unspecified: Secondary | ICD-10-CM

## 2012-01-30 NOTE — Progress Notes (Signed)
Phone call from Dr. Cain Saupe with the [facility]. Mr. Cesar Walker has had recent TIA/stroke. She would like to obtain carotids and an echo and Mr. Cesar Walker would like to have these tests at our facility. Tests have been ordered.

## 2012-02-06 ENCOUNTER — Telehealth: Payer: Self-pay | Admitting: Cardiology

## 2012-02-06 NOTE — Telephone Encounter (Signed)
Pt had ultrasound Friday afternoon at [facility] and Radiologist told the patient they would read it and send the results to you and patient was wondering if you have those results yet

## 2012-02-07 ENCOUNTER — Ambulatory Visit (HOSPITAL_COMMUNITY): Payer: Medicare Other | Attending: Cardiovascular Disease

## 2012-02-07 ENCOUNTER — Other Ambulatory Visit: Payer: Self-pay

## 2012-02-07 DIAGNOSIS — G459 Transient cerebral ischemic attack, unspecified: Secondary | ICD-10-CM

## 2012-02-07 NOTE — Telephone Encounter (Signed)
Fu call Pt said said he needs Echo results sent to Dr Benjiman Core at the [facility]

## 2012-02-08 NOTE — Telephone Encounter (Signed)
Echo results faxed to Dr Benjiman Core in the [facility] at [phone]

## 2012-02-08 NOTE — Telephone Encounter (Signed)
N/A at pt number.  LMTC. 

## 2012-02-08 NOTE — Telephone Encounter (Signed)
This does not make sense to me. I thought he was having echo here in our office. I am out this week.

## 2012-02-09 ENCOUNTER — Telehealth: Payer: Self-pay | Admitting: Cardiology

## 2012-02-09 NOTE — Telephone Encounter (Signed)
Patient states that he is aware that his echo and carotid test results are normal. He is still having problems of dizziness and being disoriented at times. He said sometime he is driving, and all the sudden he does not know where he is. He would like to know if he can have an MRI, because he said does not want to be home bound at his age

## 2012-02-09 NOTE — Telephone Encounter (Signed)
Pt calling re echo and carotid were normal but he is still having problems, requesting to have an MRI, pls call

## 2012-02-12 NOTE — Telephone Encounter (Signed)
Fu call Pt calling back about this issue 

## 2012-02-13 ENCOUNTER — Telehealth: Payer: Self-pay | Admitting: Cardiology

## 2012-02-13 NOTE — Telephone Encounter (Signed)
Spoke with pt on his cell, he is having dizziness that started last Friday. He reports it comes and goes. It usually occurs while walking. He denies trouble with turning his head or dizziness when lies down. His bp this am was 156/90, but this is high for him. He has never been able to check his bp while dizzy, he is usually not at home. He is taking lisinopril 20 mg daily at bedtime and also norvasc 10 mg daily. He thinks he needs an MRI of his head to see if anything is going on. He would like for [place] to order this or he can have it done at the [place]. Will forward for dr Jens Som review

## 2012-02-13 NOTE — Telephone Encounter (Signed)
Patient should be seen by primary care or neurology Cesar Walker

## 2012-02-13 NOTE — Telephone Encounter (Signed)
Spoke with pt, Aware of dr crenshaw's recommendations.  °

## 2012-02-13 NOTE — Telephone Encounter (Signed)
Pt was wondering if he needs an MRI on his head because of the dizziness he has been having. He is also working with [facility] so he just needs to know what to do. He has been calling but he can not get anybody to call him back with answer. Please try both numbers to reach him home and cell.

## 2012-02-28 ENCOUNTER — Other Ambulatory Visit: Payer: Medicare Other

## 2012-03-26 ENCOUNTER — Encounter: Payer: Self-pay | Admitting: Cardiology

## 2012-03-28 ENCOUNTER — Ambulatory Visit: Payer: Medicare Other | Admitting: Internal Medicine

## 2012-04-18 ENCOUNTER — Ambulatory Visit (INDEPENDENT_AMBULATORY_CARE_PROVIDER_SITE_OTHER): Payer: Medicare Other | Admitting: Cardiology

## 2012-04-18 ENCOUNTER — Other Ambulatory Visit: Payer: Self-pay | Admitting: *Deleted

## 2012-04-18 ENCOUNTER — Encounter: Payer: Self-pay | Admitting: Cardiology

## 2012-04-18 VITALS — BP 133/74 | HR 73 | Wt 160.0 lb

## 2012-04-18 DIAGNOSIS — E7800 Pure hypercholesterolemia, unspecified: Secondary | ICD-10-CM

## 2012-04-18 DIAGNOSIS — I10 Essential (primary) hypertension: Secondary | ICD-10-CM

## 2012-04-18 DIAGNOSIS — R609 Edema, unspecified: Secondary | ICD-10-CM | POA: Insufficient documentation

## 2012-04-18 MED ORDER — FUROSEMIDE 20 MG PO TABS: 20.0000 mg | ORAL_TABLET | Freq: Every day | ORAL | Status: DC

## 2012-04-18 MED ORDER — FUROSEMIDE 20 MG PO TABS: 20.0000 mg | ORAL_TABLET | ORAL | Status: DC

## 2012-04-18 NOTE — Patient Instructions (Addendum)
Your physician recommends that you schedule a follow-up appointment in: 3 MONTHS WITH DR CRENSHAW  STOP PRAVACHOL  START FUROSEMIDE 20 MG ONE TABLET EVERY OTHER DAY   Your physician recommends that you return for lab work IN ONE WEEK

## 2012-04-18 NOTE — Assessment & Plan Note (Signed)
He is complaining of leg myalgias. Check CK. Discontinue Pravachol. If his symptoms improve off of this medication we'll try a different statin.

## 2012-04-18 NOTE — Progress Notes (Signed)
HPI: Pleasant male for fu of hypertension and hyperlipidemia. Nuclear study in July of 2009 showed no ischemia or infarction and an ejection fraction of 72%. Echocardiogram in April of 2013 showed normal LV function and mild left atrial enlargement. Since he was last seen the patient has chronic dyspnea on exertion following resection of his lung. There is no orthopnea or PND. There is no chest pain. There is no syncope. He has noticed new onset mild pedal edema. He also describes pain in his legs bilaterally that is continuous and not related to ambulation.   Current Outpatient Prescriptions  Medication Sig Dispense Refill  . albuterol (PROVENTIL HFA;VENTOLIN HFA) 108 (90 BASE) MCG/ACT inhaler Inhale 2 puffs into the lungs 3 (three) times daily.       Marland Kitchen albuterol (PROVENTIL) (2.5 MG/3ML) 0.083% nebulizer solution Take 2.5 mg by nebulization every 4 (four) hours as needed.        Marland Kitchen amLODipine (NORVASC) 10 MG tablet Take 10 mg by mouth daily.        Marland Kitchen aspirin 325 MG tablet Take 325 mg by mouth daily.      Marland Kitchen lisinopril (PRINIVIL,ZESTRIL) 40 MG tablet Take 20 mg by mouth daily.      . pravastatin (PRAVACHOL) 40 MG tablet Take 20 mg by mouth daily.      Marland Kitchen DISCONTD: albuterol-ipratropium (COMBIVENT) 18-103 MCG/ACT inhaler Inhale 2 puffs into the lungs every 6 (six) hours as needed.        Marland Kitchen DISCONTD: pravastatin (PRAVACHOL) 20 MG tablet Take 1 tablet (20 mg total) by mouth every evening.  30 tablet  11   Current Facility-Administered Medications  Medication Dose Route Frequency Provider Last Rate Last Dose  . methylPREDNISolone acetate (DEPO-MEDROL) injection 120 mg  120 mg Intramuscular Once Julio Sicks, NP         Past Medical History  Diagnosis Date  . Carcinoma of lung     S/P resection  . GERD (gastroesophageal reflux disease)   . Hypercholesteremia   . HTN (hypertension), benign     s/p echo in 2009 showing EF of 55 to 60% with mild LVH and impaired diastolic relaxation  . Asthma    . COPD (chronic obstructive pulmonary disease)   . Anxiety   . Normal nuclear stress test 2009    EF of 72% with no ischemia or infarction    Past Surgical History  Procedure Date  . Left lower lobectomy for cancer 1997  . Rull lung nodule removed for cancer 2002  . US echocardiography 11/2007    EF 55-60% WITH MILD LVH, IMPAIRED DIASTOLIC RELAXATION  . US echocardiography 01/18/2005    EF 55-60%  . Cardiovascular stress test 06/23/2005    EF 70%, NO EVIDENCE OF ISCHEMIA    History   Social History  . Marital Status: Widowed    Spouse Name: N/A    Number of Children: 2  . Years of Education: N/A   Occupational History  . retired    Social History Main Topics  . Smoking status: Former Smoker -- 2.0 packs/day for 20 years    Types: Cigarettes    Quit date: 10/23/1978  . Smokeless tobacco: Not on file  . Alcohol Use: No  . Drug Use: Not on file  . Sexually Active: Not on file   Other Topics Concern  . Not on file   Social History Narrative  . No narrative on file    ROS: no fevers or chills, productive cough, hemoptysis,  dysphasia, odynophagia, melena, hematochezia, dysuria, hematuria, rash, seizure activity, orthopnea, PND, claudication. Remaining systems are negative.  Physical Exam: Well-developed well-nourished in no acute distress.  Skin is warm and dry.  HEENT is normal.  Neck is supple.  Chest is clear to auscultation with normal expansion.  Cardiovascular exam is regular rate and rhythm.  Abdominal exam nontender or distended. No masses palpated. Extremities show 1+ edema. neuro grossly intact  ECG sinus rhythm at a rate of 73. Right bundle branch block. Left anterior fascicular block.

## 2012-04-18 NOTE — Assessment & Plan Note (Signed)
Blood pressure controlled. Continue present medications. 

## 2012-04-26 ENCOUNTER — Other Ambulatory Visit (INDEPENDENT_AMBULATORY_CARE_PROVIDER_SITE_OTHER): Payer: Medicare Other

## 2012-04-26 DIAGNOSIS — I10 Essential (primary) hypertension: Secondary | ICD-10-CM

## 2012-04-26 LAB — BASIC METABOLIC PANEL
BUN: 16 mg/dL (ref 6–23)
CO2: 28 mEq/L (ref 19–32)
Calcium: 9.3 mg/dL (ref 8.4–10.5)
Creatinine, Ser: 0.9 mg/dL (ref 0.4–1.5)
GFR: 81.6 mL/min (ref >60.00)
Glucose, Bld: 134 mg/dL — ABNORMAL HIGH (ref 70–99)

## 2012-04-30 ENCOUNTER — Telehealth: Payer: Self-pay | Admitting: Cardiology

## 2012-04-30 NOTE — Telephone Encounter (Signed)
Spoke with pt,  aware of results. Results forwarded to dr Felipa Eth.

## 2012-04-30 NOTE — Telephone Encounter (Signed)
F/u on previous call:  Patient calling back to speak with Stanton Kidney.

## 2012-05-02 ENCOUNTER — Telehealth: Payer: Self-pay | Admitting: Cardiology

## 2012-05-02 NOTE — Telephone Encounter (Signed)
New msg Pt said his legs and feet are swollen and he has been taking fluid pills. Please call to discuss

## 2012-05-02 NOTE — Telephone Encounter (Signed)
Spoke with pt, he is still having swelling in his ankles. He is watching his salt and resting with his legs elevated. He reports he may be a little more SOB but not bad. He is taking the lasix every other day. Pt will increase his lasix to daily, he will watch the sodium intake, elevate his legs and use his support hose. He requested to see Cesar gerhardt np for evaluation. appt made for follow up for Monday 05-06-12. He will take his lasix daily until seen.

## 2012-05-06 ENCOUNTER — Telehealth: Payer: Self-pay | Admitting: Internal Medicine

## 2012-05-06 ENCOUNTER — Ambulatory Visit (INDEPENDENT_AMBULATORY_CARE_PROVIDER_SITE_OTHER): Payer: Medicare Other | Admitting: Nurse Practitioner

## 2012-05-06 ENCOUNTER — Encounter: Payer: Self-pay | Admitting: Nurse Practitioner

## 2012-05-06 VITALS — BP 130/66 | HR 83 | Ht 66.0 in | Wt 162.4 lb

## 2012-05-06 DIAGNOSIS — J449 Chronic obstructive pulmonary disease, unspecified: Secondary | ICD-10-CM

## 2012-05-06 DIAGNOSIS — R609 Edema, unspecified: Secondary | ICD-10-CM

## 2012-05-06 NOTE — Assessment & Plan Note (Addendum)
He was asking today about his referral to pulmonary rehab. We looked into this issue after his visit. Cesar Walker has been the one to delay his starting of the program.   Sue Lush - the manager at [facility] - said he was dismissed due to aggressive behavior.

## 2012-05-06 NOTE — Assessment & Plan Note (Signed)
He is doing better with support stockings and has not needed to increase the Lasix. Will leave him on his current regimen for now. He is on Norvasc but like Dr. Jens Som, I doubt this is causing his swelling. He tends to eat out very frequently. He is willing to keep using the support stockings. He may use the Lasix just as needed if his swelling is resolved with the stockings. We will see him back in September as scheduled. Patient is agreeable to this plan and will call if any problems develop in the interim.

## 2012-05-06 NOTE — Patient Instructions (Signed)
Stay on your current medicines  Watch your salt  Keep wearing your support stockings each day  If your swelling continues to improve, you may use the Lasix just "as needed"  We will see you in September as scheduled.  Call the [facility] office at [phone] if you have any questions, problems or concerns.

## 2012-05-06 NOTE — Progress Notes (Signed)
[name] Date of Birth: 26-Sep-1928 Medical Record #[id number]  History of Present Illness: Cesar Walker is seen back today for a work in visit. He is seen for Dr. Jens Som. He has HTN and HLD. He is chronically short of breath due to prior lung resection. His last stress test was in 2009 and was normal. Recent echo back in April showed normal LV function and mild LAE. He was here at the end of June and noted to have some lower extremity edema. Was started on low dose Lasix every other day with no significant improvement. Lasix was increased to every day. He called last week to report no change in his edema and the Lasix was increased to every day. He was told to watch his salt and elevate his legs.   He comes in today. He is here alone. He is doing ok. His swelling has improved. He started wearing his support stockings and did not increase the Lasix. Just using Lasix every other day still. Does eat out and probably gets too much salt. No chest pain. His breathing is at his baseline.   Current Outpatient Prescriptions on File Prior to Visit  Medication Sig Dispense Refill  . albuterol (PROVENTIL HFA;VENTOLIN HFA) 108 (90 BASE) MCG/ACT inhaler Inhale 2 puffs into the lungs 3 (three) times daily.       Marland Kitchen albuterol (PROVENTIL) (2.5 MG/3ML) 0.083% nebulizer solution Take 2.5 mg by nebulization every 4 (four) hours as needed.        Marland Kitchen amLODipine (NORVASC) 10 MG tablet Take 10 mg by mouth daily.        Marland Kitchen aspirin 325 MG tablet Take 325 mg by mouth daily.      . furosemide (LASIX) 20 MG tablet Take 1 tablet (20 mg total) by mouth every other day.  30 tablet  12  . lisinopril (PRINIVIL,ZESTRIL) 40 MG tablet Take 20 mg by mouth daily.      Marland Kitchen DISCONTD: albuterol-ipratropium (COMBIVENT) 18-103 MCG/ACT inhaler Inhale 2 puffs into the lungs every 6 (six) hours as needed.         Current Facility-Administered Medications on File Prior to Visit  Medication Dose Route Frequency Provider Last Rate Last Dose  .  methylPREDNISolone acetate (DEPO-MEDROL) injection 120 mg  120 mg Intramuscular Once Tammy Rogers [employer], NP        No Known Allergies  Past Medical History  Diagnosis Date  . Carcinoma of lung     S/P resection  . GERD (gastroesophageal reflux disease)   . Hypercholesteremia   . HTN (hypertension), benign     s/p echo in 2013 showing EF of 60 to65% with mild LVH and mild LAE  . Asthma   . COPD (chronic obstructive pulmonary disease)   . Anxiety   . Normal nuclear stress test 2009    EF of 72% with no ischemia or infarction    Past Surgical History  Procedure Date  . Left lower lobectomy for cancer 1997  . Rull lung nodule removed for cancer 2002  . US echocardiography 11/2007    EF 55-60% WITH MILD LVH, IMPAIRED DIASTOLIC RELAXATION  . US echocardiography 01/18/2005    EF 55-60%  . Cardiovascular stress test 06/23/2005    EF 70%, NO EVIDENCE OF ISCHEMIA    History  Smoking status  . Former Smoker -- 2.0 packs/day for 20 years  . Types: Cigarettes  . Quit date: 10/23/1978  Smokeless tobacco  . Not on file    History  Alcohol  Use No    Family History  Problem Relation Age of Onset  . Heart disease Father   . Cancer Mother     bladder  . Heart disease Maternal Grandfather     Review of Systems: The review of systems is per the HPI.  All other systems were reviewed and are negative.  Physical Exam: BP 130/66  Pulse 83  Ht 5\' 6"  (1.676 m)  Wt 162 lb 6.4 oz (73.664 kg)  BMI 26.21 kg/m2 Patient is very pleasant and in no acute distress. Skin is warm and dry. Color is normal.  HEENT is unremarkable. Normocephalic/atraumatic. PERRL. Sclera are nonicteric. Neck is supple. No masses. No JVD. Lungs are clear. Cardiac exam shows a regular rate and rhythm. Abdomen is soft. Extremities are without any edema today. Gait and ROM are intact. No gross neurologic deficits noted.   LABORATORY DATA: N/A   Lab Results  Component Value Date   WBC 10.6* 01/16/2012   HGB 17.4*  01/16/2012   HCT 51.6 01/16/2012   PLT 261.0 01/16/2012   GLUCOSE 134* 04/26/2012   CHOL 209* 01/16/2012   TRIG 114.0 01/16/2012   HDL 57.20 01/16/2012   LDLDIRECT 149.0 01/16/2012   ALT 22 01/16/2012   AST 19 01/16/2012   NA 142 04/26/2012   K 4.0 04/26/2012   CL 107 04/26/2012   CREATININE 0.9 04/26/2012   BUN 16 04/26/2012   CO2 28 04/26/2012     Assessment / Plan:

## 2012-05-06 NOTE — Telephone Encounter (Signed)
Called and spoke with Marily Memos at [facility] in regards to status of order for pulmonary rehab. Per Marily Memos, patient was in pulmonary rehab program before and had to be d/c from the program, due to aggressive behavior with patients and staff. Because of this behavior patient will not be allowed to return. Sue Lush is the Publishing rights manager at [facility] and you may contact her for further details at [phone]. Called Sharone at Cardiology and advised her of the above. Rhonda J Cobb

## 2012-07-19 ENCOUNTER — Ambulatory Visit: Payer: Medicare Other | Admitting: Cardiology

## 2012-07-22 ENCOUNTER — Ambulatory Visit (INDEPENDENT_AMBULATORY_CARE_PROVIDER_SITE_OTHER): Payer: Medicare Other | Admitting: Cardiology

## 2012-07-22 ENCOUNTER — Encounter: Payer: Self-pay | Admitting: Cardiology

## 2012-07-22 VITALS — BP 130/70 | HR 78 | Ht 66.0 in | Wt 158.0 lb

## 2012-07-22 DIAGNOSIS — I10 Essential (primary) hypertension: Secondary | ICD-10-CM

## 2012-07-22 DIAGNOSIS — E7800 Pure hypercholesterolemia, unspecified: Secondary | ICD-10-CM

## 2012-07-22 NOTE — Progress Notes (Signed)
HPI: Pleasant male for fu of hypertension and hyperlipidemia. Nuclear study in July of 2009 showed no ischemia or infarction and an ejection fraction of 72%. Echocardiogram in April of 2013 showed normal LV function and mild left atrial enlargement. Since he was last seen in June of 2013 the patient has chronic dyspnea on exertion following resection of his lung. There is no orthopnea or PND. There is no chest pain. There is no syncope. His pedal edema has reolved.  Current Outpatient Prescriptions  Medication Sig Dispense Refill  . albuterol (PROVENTIL HFA;VENTOLIN HFA) 108 (90 BASE) MCG/ACT inhaler Inhale 2 puffs into the lungs 3 (three) times daily.       Marland Kitchen albuterol (PROVENTIL) (2.5 MG/3ML) 0.083% nebulizer solution Take 2.5 mg by nebulization every 4 (four) hours as needed.        Marland Kitchen amLODipine (NORVASC) 10 MG tablet Take 10 mg by mouth daily.        Marland Kitchen aspirin 325 MG tablet Take 325 mg by mouth daily.      Marland Kitchen lisinopril (PRINIVIL,ZESTRIL) 40 MG tablet Take 40 mg by mouth daily.       Marland Kitchen DISCONTD: albuterol-ipratropium (COMBIVENT) 18-103 MCG/ACT inhaler Inhale 2 puffs into the lungs every 6 (six) hours as needed.         Current Facility-Administered Medications  Medication Dose Route Frequency Provider Last Rate Last Dose  . DISCONTD: methylPREDNISolone acetate (DEPO-MEDROL) injection 120 mg  120 mg Intramuscular Once Julio Sicks, NP         Past Medical History  Diagnosis Date  . Carcinoma of lung     S/P resection  . GERD (gastroesophageal reflux disease)   . Hypercholesteremia   . HTN (hypertension), benign     s/p echo in 2013 showing EF of 60 to65% with mild LVH and mild LAE  . Asthma   . COPD (chronic obstructive pulmonary disease)   . Anxiety   . Normal nuclear stress test 2009    EF of 72% with no ischemia or infarction    Past Surgical History  Procedure Date  . Left lower lobectomy for cancer 1997  . Rull lung nodule removed for cancer 2002  . US  echocardiography 11/2007    EF 55-60% WITH MILD LVH, IMPAIRED DIASTOLIC RELAXATION  . US echocardiography 01/18/2005    EF 55-60%  . Cardiovascular stress test 06/23/2005    EF 70%, NO EVIDENCE OF ISCHEMIA    History   Social History  . Marital Status: Widowed    Spouse Name: N/A    Number of Children: 2  . Years of Education: N/A   Occupational History  . retired    Social History Main Topics  . Smoking status: Former Smoker -- 2.0 packs/day for 20 years    Types: Cigarettes    Quit date: 10/23/1978  . Smokeless tobacco: Not on file  . Alcohol Use: No  . Drug Use: Not on file  . Sexually Active: Not on file   Other Topics Concern  . Not on file   Social History Narrative  . No narrative on file    ROS: no fevers or chills, productive cough, hemoptysis, dysphasia, odynophagia, melena, hematochezia, dysuria, hematuria, rash, seizure activity, orthopnea, PND, pedal edema, claudication. Remaining systems are negative.  Physical Exam: Well-developed well-nourished in no acute distress.  Skin is warm and dry.  HEENT is normal.  Neck is supple.  Chest is clear to auscultation with normal expansion.  Cardiovascular exam is regular rate  and rhythm.  Abdominal exam nontender or distended. No masses palpated. Extremities show no edema. neuro grossly intact

## 2012-07-22 NOTE — Patient Instructions (Addendum)
Your physician wants you to follow-up in: ONE YEAR WITH DR CRENSHAW You will receive a reminder letter in the mail two months in advance. If you don't receive a letter, please call our office to schedule the follow-up appointment.  

## 2012-07-22 NOTE — Assessment & Plan Note (Signed)
Continue diet. Statin was discontinued previously because of myalgias.

## 2012-07-22 NOTE — Assessment & Plan Note (Signed)
Blood pressure controlled. Continue present medications. His edema has resolved. He'll continue to take Lasix when necessary.

## 2012-08-01 ENCOUNTER — Telehealth: Payer: Self-pay | Admitting: Cardiology

## 2012-08-01 DIAGNOSIS — R609 Edema, unspecified: Secondary | ICD-10-CM

## 2012-08-01 MED ORDER — FUROSEMIDE 20 MG PO TABS: 20.0000 mg | ORAL_TABLET | Freq: Every day | ORAL | Status: DC

## 2012-08-01 NOTE — Telephone Encounter (Signed)
Spoke with pt, Aware of dr Ludwig Clarks recommendations. He will come for labs next week.

## 2012-08-01 NOTE — Telephone Encounter (Signed)
Lasix 20 mg po daily; bmet one week Jaymie Mckiddy  

## 2012-08-01 NOTE — Telephone Encounter (Signed)
Spoke with pt, he cont to have trouble with swelling in his legs. They are down in the morning but by mid day they are swollen. His weight is about the same and his SOB maybe a little worse than his normal. He no longer has any furosemide. Will forward for dr Jens Som review

## 2012-08-01 NOTE — Telephone Encounter (Signed)
New Problem:    Patient called in because his legs are continuing to swell and he would like to consult someone about this.  Please call back.

## 2012-08-08 ENCOUNTER — Other Ambulatory Visit (INDEPENDENT_AMBULATORY_CARE_PROVIDER_SITE_OTHER): Payer: Medicare Other

## 2012-08-08 DIAGNOSIS — R609 Edema, unspecified: Secondary | ICD-10-CM

## 2012-08-08 LAB — BASIC METABOLIC PANEL
BUN: 14 mg/dL (ref 6–23)
Calcium: 9.2 mg/dL (ref 8.4–10.5)
Creatinine, Ser: 0.9 mg/dL (ref 0.4–1.5)
GFR: 82.56 mL/min (ref >60.00)

## 2012-08-09 ENCOUNTER — Telehealth: Payer: Self-pay | Admitting: Cardiology

## 2012-08-09 DIAGNOSIS — I10 Essential (primary) hypertension: Secondary | ICD-10-CM

## 2012-08-09 MED ORDER — POTASSIUM CHLORIDE CRYS ER 20 MEQ PO TBCR: EXTENDED_RELEASE_TABLET | ORAL | Status: DC

## 2012-08-09 NOTE — Telephone Encounter (Signed)
Pt aware of lab results and will start Potassium each day he takes his lasix. Returning on 08/21/12 for repeat bmp. Mylo Red RN

## 2012-08-09 NOTE — Telephone Encounter (Signed)
Pt rtn call to debra re lab results, pls call

## 2012-08-14 ENCOUNTER — Telehealth: Payer: Self-pay | Admitting: *Deleted

## 2012-08-14 NOTE — Telephone Encounter (Signed)
lmptcb to go over lab results, to start K+ 20 meq and bmet in 2 weeks

## 2012-08-14 NOTE — Telephone Encounter (Signed)
Message copied by Tarri Fuller on Wed Aug 14, 2012 10:39 AM ------      Message from: Lewayne Bunting      Created: Thu Aug 08, 2012  5:05 PM       kcl 20 meq po each time he takes lasix and bmet 2 weeks      Olga Millers

## 2012-08-21 ENCOUNTER — Other Ambulatory Visit: Payer: Self-pay

## 2012-08-21 ENCOUNTER — Other Ambulatory Visit: Payer: Medicare Other

## 2012-08-23 ENCOUNTER — Telehealth: Payer: Self-pay | Admitting: Cardiology

## 2012-08-23 NOTE — Telephone Encounter (Signed)
Pt rtn call [phone]

## 2012-08-23 NOTE — Telephone Encounter (Signed)
Spoke with pt, aware of lab results and instructions. He will return Monday for repeat labs

## 2012-08-26 ENCOUNTER — Other Ambulatory Visit: Payer: Medicare Other

## 2012-08-27 ENCOUNTER — Other Ambulatory Visit (INDEPENDENT_AMBULATORY_CARE_PROVIDER_SITE_OTHER): Payer: Medicare Other

## 2012-08-27 DIAGNOSIS — I10 Essential (primary) hypertension: Secondary | ICD-10-CM

## 2012-08-27 LAB — BASIC METABOLIC PANEL
BUN: 18 mg/dL (ref 6–23)
Calcium: 9.2 mg/dL (ref 8.4–10.5)
GFR: 101.12 mL/min (ref >60.00)
Glucose, Bld: 81 mg/dL (ref 70–99)

## 2012-10-19 ENCOUNTER — Emergency Department (INDEPENDENT_AMBULATORY_CARE_PROVIDER_SITE_OTHER)
Admission: EM | Admit: 2012-10-19 | Discharge: 2012-10-19 | Disposition: A | Discharge status: Home / Self Care | Payer: Medicare Other | Source: Home / Self Care | Attending: Family Medicine | Admitting: Family Medicine

## 2012-10-19 ENCOUNTER — Emergency Department (INDEPENDENT_AMBULATORY_CARE_PROVIDER_SITE_OTHER): Payer: PRIVATE HEALTH INSURANCE

## 2012-10-19 ENCOUNTER — Encounter (HOSPITAL_COMMUNITY): Payer: Self-pay | Admitting: *Deleted

## 2012-10-19 DIAGNOSIS — J069 Acute upper respiratory infection, unspecified: Secondary | ICD-10-CM

## 2012-10-19 DIAGNOSIS — J329 Chronic sinusitis, unspecified: Secondary | ICD-10-CM

## 2012-10-19 DIAGNOSIS — J449 Chronic obstructive pulmonary disease, unspecified: Secondary | ICD-10-CM

## 2012-10-19 MED ORDER — IPRATROPIUM BROMIDE 0.02 % IN SOLN
0.2500 mg | Freq: Once | RESPIRATORY_TRACT | Status: AC
  Administered 2012-10-19: 0.26 mg via RESPIRATORY_TRACT

## 2012-10-19 MED ORDER — PREDNISONE 10 MG PO TABS: ORAL_TABLET | ORAL | Status: DC

## 2012-10-19 MED ORDER — DOXYCYCLINE HYCLATE 100 MG PO CAPS: 100.0000 mg | ORAL_CAPSULE | Freq: Two times a day (BID) | ORAL | Status: DC

## 2012-10-19 MED ORDER — ALBUTEROL SULFATE (5 MG/ML) 0.5% IN NEBU
2.5000 mg | INHALATION_SOLUTION | Freq: Once | RESPIRATORY_TRACT | Status: AC
  Administered 2012-10-19: 2.5 mg via RESPIRATORY_TRACT

## 2012-10-19 MED ORDER — GUAIFENESIN-CODEINE 100-10 MG/5ML PO SYRP: 5.0000 mL | ORAL_SOLUTION | Freq: Three times a day (TID) | ORAL | Status: DC | PRN

## 2012-10-19 MED ORDER — METHYLPREDNISOLONE SODIUM SUCC 125 MG IJ SOLR
80.0000 mg | Freq: Once | INTRAMUSCULAR | Status: AC
  Administered 2012-10-19: 80 mg via INTRAMUSCULAR

## 2012-10-19 MED ORDER — METHYLPREDNISOLONE SODIUM SUCC 125 MG IJ SOLR
INTRAMUSCULAR | Status: AC
  Filled 2012-10-19: qty 2

## 2012-10-19 MED ORDER — ALBUTEROL SULFATE (5 MG/ML) 0.5% IN NEBU
INHALATION_SOLUTION | RESPIRATORY_TRACT | Status: AC
  Filled 2012-10-19: qty 0.5

## 2012-10-19 NOTE — ED Provider Notes (Signed)
History     CSN: [id number]  Arrival date & time 10/19/12  1102   First MD Initiated Contact with Patient 10/19/12 1117      Chief Complaint  Patient presents with  . Cough    (Consider location/radiation/quality/duration/timing/severity/associated sxs/prior treatment) HPI Comments: 78 year old male with history of COPD, status post bilateral lung resections due to history of lung carcinoma among other comorbidities. Here complaining of persistent cough and "chest congestion"with productive sputum of yellow/ mucus for 3 days. Symptoms associated with shortness of breath and wheezing although as per pulmonology notes; patient has a history of chronic dyspnea. Denies fever or chills. Appetite is good at baseline. The patient is taking fluids without difficulty. Patient reported symptoms are associated with nasal congestion and green/yellow rhinorrhea and sinus pressure. Denies sore throat. Denies chest pain. Denies paroxysmal nocturnal dyspnea. Denies leg swelling. Patient is currently not taking Lasix for leg swelling, he is using compression stockings and they're working well. As per records no history of congestive heart failure.  Patient states he has tried to treat his symptoms with over-the-counter medications but no significant improvement, his worried that his symptoms are not improving has used his albuterol inhaler last time yesterday evening and about 3 hours ago with some improvement of his symptoms. States he does not need refills on his albuterol.    Past Medical History  Diagnosis Date  . Carcinoma of lung     S/P resection  . GERD (gastroesophageal reflux disease)   . Hypercholesteremia   . HTN (hypertension), benign     s/p echo in 2013 showing EF of 60 to65% with mild LVH and mild LAE  . Asthma   . COPD (chronic obstructive pulmonary disease)   . Anxiety   . Normal nuclear stress test 2009    EF of 72% with no ischemia or infarction    Past Surgical History    Procedure Date  . Left lower lobectomy for cancer 1997  . Rull lung nodule removed for cancer 2002  . US echocardiography 11/2007    EF 55-60% WITH MILD LVH, IMPAIRED DIASTOLIC RELAXATION  . US echocardiography 01/18/2005    EF 55-60%  . Cardiovascular stress test 06/23/2005    EF 70%, NO EVIDENCE OF ISCHEMIA    Family History  Problem Relation Age of Onset  . Heart disease Father   . Cancer Mother     bladder  . Heart disease Maternal Grandfather     History  Substance Use Topics  . Smoking status: Former Smoker -- 2.0 packs/day for 20 years    Types: Cigarettes    Quit date: 10/23/1978  . Smokeless tobacco: Not on file  . Alcohol Use: No      Review of Systems  Constitutional: Negative for fever, chills, diaphoresis, activity change, appetite change, fatigue and unexpected weight change.  HENT: Positive for congestion, rhinorrhea and sinus pressure. Negative for sore throat and neck pain.   Eyes: Negative for discharge.  Respiratory: Positive for cough, shortness of breath and wheezing.   Cardiovascular: Negative for chest pain, palpitations and leg swelling.  Musculoskeletal: Negative for myalgias and arthralgias.  Skin: Negative for rash.  Neurological: Negative for dizziness and headaches.    Allergies  Review of patient's allergies indicates no known allergies.  Home Medications   Current Outpatient Rx  Name  Route  Sig  Dispense  Refill  . ALBUTEROL SULFATE HFA 108 (90 BASE) MCG/ACT IN AERS   Inhalation   Inhale 2 puffs into the  lungs 3 (three) times daily.          . ALBUTEROL SULFATE (2.5 MG/3ML) 0.083% IN NEBU   Nebulization   Take 2.5 mg by nebulization every 4 (four) hours as needed.           Marland Kitchen AMLODIPINE BESYLATE 10 MG PO TABS   Oral   Take 10 mg by mouth daily.           . ASPIRIN 325 MG PO TABS   Oral   Take 325 mg by mouth daily.         Marland Kitchen DOXYCYCLINE HYCLATE 100 MG PO CAPS   Oral   Take 1 capsule (100 mg total) by mouth 2 (two)  times daily.   20 capsule   0   . FUROSEMIDE 20 MG PO TABS   Oral   Take 1 tablet (20 mg total) by mouth daily.   30 tablet   12   . GUAIFENESIN-CODEINE 100-10 MG/5ML PO SYRP   Oral   Take 5 mLs by mouth 3 (three) times daily as needed for cough.   120 mL   0   . LISINOPRIL 40 MG PO TABS   Oral   Take 40 mg by mouth daily.          Marland Kitchen POTASSIUM CHLORIDE CRYS ER 20 MEQ PO TBCR      1 tablet each day you take your lasix   30 tablet   3   . PREDNISONE 10 MG PO TABS      Take  4tabs by mouth on day #1 then,  3 tabs on day#2,    2 tbas on day#3,  1 tab po on  Day#4,  1/2 tab po on day#5   11 tablet   0     BP 135/70  Pulse 90  Temp 98.1 F (36.7 C) (Oral)  Resp 19  SpO2 96%  Physical Exam  Nursing note and vitals reviewed. Constitutional: He is oriented to person, place, and time. He appears well-developed and well-nourished. No distress.  HENT:  Head: Normocephalic and atraumatic.       Nasal Congestion with erythema and swelling of nasal turbinates, white thin rhinorrhea. Nasal voice. Reports pressure discomfort with bilateral maxillary sinus palpation. Mild pharyngeal erythema no exudates. No uvula deviation. No trismus. TM's normal.   Eyes: EOM are normal. Pupils are equal, round, and reactive to light. Right eye exhibits no discharge. Left eye exhibits no discharge.       Conjunctival erythema bilaterally with mils hypertrophy and exposure of lower conjunctiva.   Neck: Neck supple. No JVD present.  Cardiovascular: Normal rate, regular rhythm and normal heart sounds.  Exam reveals no gallop and no friction rub.   Pulmonary/Chest: Effort normal. He has no wheezes. He exhibits no tenderness.       Bronchitic cough. Scattered bilateral expiratory rhonchi. No tachypnea or orthopnea. Decreased breath sounds at bases. No rales/crackles.   Abdominal: Soft.  Lymphadenopathy:    He has no cervical adenopathy.  Neurological: He is alert and oriented to person,  place, and time.  Skin: No rash noted. He is not diaphoretic.    ED Course  Procedures (including critical care time)  Labs Reviewed - No data to display Dg Chest 2 View  10/19/2012  *RADIOLOGY REPORT*  Clinical Data: Cough.  Short of breath.  Lung carcinoma.  CHEST - 2 VIEW  Comparison: 11/02/2011 and 06/19/2011  Findings: Prior right thoracotomy again demonstrated with associated surgical clips  and staples.  Right hilar soft tissue prominence remains stable.  Both lungs are clear.  No evidence of pleural effusion.  Heart size remains normal.  No significant changes seen compared to prior exam.  IMPRESSION: Stable postoperative changes.  No acute findings.   Original Report Authenticated By: Myles Rosenthal, M.D.      1. COPD (chronic obstructive pulmonary disease)   2. Sinusitis   3. URI (upper respiratory infection)     EKG: Normal sinus rhythm, ventricular rate at 85 beats per minutes. Right bundle branch block not new. No acute or ischemic changes. No changes compared with prior EKG.   MDM  Patient was treated here with albuterol 2.5 mg and Atrovent 2.5 mcg nebulization x1. Also had administered Solu-Medrol IM 80 mg x1. Lung examination was clear at the time of discharge. Patient reported improvement of his symptoms. Prescribed prednisone taper. Doxycycline and guaifenesin/codeine. Asked to followup with his primary care provider. Asked to go to the emergency department if new or worsening symptoms like chest pain, fever and worsening breathing.        Sharin Grave, MD 10/21/12 [phone]

## 2012-10-19 NOTE — ED Notes (Signed)
Pt  Reports  Symptoms  Of  Cough   Congested              X  3  Days        Pt  Reports  Symptoms  Not releived  By otc  meds

## 2012-11-01 ENCOUNTER — Encounter: Payer: Self-pay | Admitting: Adult Health

## 2012-11-01 ENCOUNTER — Telehealth: Payer: Self-pay | Admitting: Internal Medicine

## 2012-11-01 ENCOUNTER — Ambulatory Visit (INDEPENDENT_AMBULATORY_CARE_PROVIDER_SITE_OTHER): Payer: Medicare Other | Admitting: Adult Health

## 2012-11-01 VITALS — BP 120/74 | HR 84 | Temp 97.1°F | Ht 62.0 in | Wt 160.6 lb

## 2012-11-01 DIAGNOSIS — J449 Chronic obstructive pulmonary disease, unspecified: Secondary | ICD-10-CM

## 2012-11-01 MED ORDER — AMOXICILLIN-POT CLAVULANATE 875-125 MG PO TABS: 1.0000 | ORAL_TABLET | Freq: Two times a day (BID) | ORAL | Status: AC

## 2012-11-01 NOTE — Progress Notes (Signed)
   Patient ID: Cesar Walker, male    DOB: 1928-09-15, 79 y.o.   MRN: [id number]  HPI 79 yo male with known hx of COPD, Asthma, hx of lung cancer s/p resection  11/01/2012 Acute OV  Complains of sinus congestion w/ yellow drainage, PND w/ prod cough, wheezing, some tightness and SOB, low grade temp x2 weeks.  Was seen in urgent care on December 27 and given doxycycline and pred taper and depo. Feels he is  improved with decreased cough and congestion in his chest. However, he continues to have sinus pain, and pressure, and blows out thick, yellow, bloody discharge. At times. Wheezing, and shortness of breath have diminished He denies any hemoptysis, orthopnea, PND, or leg swelling He has not been seen in the office in greater than 9 months and have advised him on keeping his followup appointments. CXR w/ NAD at [facility] visit.   Review of Systems Constitutional:   No  weight loss, night sweats,  Fevers, chills,  +fatigue, or  lassitude.  HEENT:   No headaches,  Difficulty swallowing,  Tooth/dental problems, or  Sore throat,                No sneezing, itching, ear ache,  +nasal congestion, post nasal drip,   CV:  No chest pain,  Orthopnea, PND, swelling in lower extremities, anasarca, dizziness, palpitations, syncope.   GI  No heartburn, indigestion, abdominal pain, nausea, vomiting, diarrhea, change in bowel habits, loss of appetite, bloody stools.   Resp:   No coughing up of blood.     No chest wall deformity  Skin: no rash or lesions.  GU: no dysuria, change in color of urine, no urgency or frequency.  No flank pain, no hematuria   MS:  No joint pain or swelling.  No decreased range of motion.  No back pain.  Psych:  No change in mood or affect. No depression or anxiety.  No memory loss.         Objective:   Physical Exam GEN: A/Ox3; pleasant , NAD,  Elderly   HEENT:  [place]/AT,  EACs-clear, TMs-wnl, NOSE-clear w/ max sinus tenderness,  THROAT-clear, no lesions, no postnasal drip or  exudate noted.   NECK:  Supple w/ fair ROM; no JVD; normal carotid impulses w/o bruits; no thyromegaly or nodules palpated; no lymphadenopathy.  RESP  Coarse BS w/ exp wheezing  no accessory muscle use, no dullness to percussion  CARD:  RRR, no m/r/g  , no peripheral edema, pulses intact, no cyanosis or clubbing.  GI:   Soft & nt; nml bowel sounds; no organomegaly or masses detected.  Musco: Warm bil, no deformities or joint swelling noted.   Neuro: alert, no focal deficits noted.    Skin: Warm, no lesions or rashes

## 2012-11-01 NOTE — Assessment & Plan Note (Signed)
Slowly resolving flare with associated Sinusitis    Plan  Augmentin 875mg  Twice daily  For 10 days  Mucinex DM Twice daily  As needed  Cough/congesiton  Nasonex 2 puffs daily unitl sample is gone.  Fluids and rest  Saline nasal rinses As needed   Please contact office for sooner follow up if symptoms do not improve or worsen or seek emergency care  follow up Dr. Maple Hudson  In 6 weeks

## 2012-11-01 NOTE — Patient Instructions (Addendum)
Augmentin 875mg  Twice daily  For 10 days  Mucinex DM Twice daily  As needed  Cough/congesiton  Nasonex 2 puffs daily unitl sample is gone.  Fluids and rest  Saline nasal rinses As needed   Please contact office for sooner follow up if symptoms do not improve or worsen or seek emergency care  follow up Dr. Maple Hudson  In 6 weeks

## 2012-11-01 NOTE — Telephone Encounter (Signed)
I spoke with Cesar Walker. Requesting to come in and be seen today. Per Evelene Croon okay to bring him in to see TP today. Cesar Walker is scheduled to see TP at 12:00. Nothing further was needed

## 2012-11-08 ENCOUNTER — Encounter: Payer: Self-pay | Admitting: Adult Health

## 2012-11-08 ENCOUNTER — Telehealth: Payer: Self-pay | Admitting: Internal Medicine

## 2012-11-08 ENCOUNTER — Ambulatory Visit (INDEPENDENT_AMBULATORY_CARE_PROVIDER_SITE_OTHER): Payer: Medicare Other | Admitting: Adult Health

## 2012-11-08 VITALS — BP 112/68 | HR 74 | Temp 98.6°F | Ht 62.0 in | Wt 160.0 lb

## 2012-11-08 DIAGNOSIS — J449 Chronic obstructive pulmonary disease, unspecified: Secondary | ICD-10-CM

## 2012-11-08 NOTE — Patient Instructions (Addendum)
Finish Augmentin Mucinex DM Twice daily  As needed  Cough/congesiton  Fluids and rest  Saline nasal rinses As needed   Debrox As needed  For ear wax  Please contact office for sooner follow up if symptoms do not improve or worsen or seek emergency care  follow up Dr. Maple Hudson  In 6 weeks

## 2012-11-08 NOTE — Telephone Encounter (Signed)
I spoke with pt. He c/o ear pressure and feels stopped up. Cough is better/congestion is better. Still wheezing and SOB. He is taking ABX. Per Dr. Maple Hudson he can come in and see PT today if she has an opening. Pt is scheduled to come in at 3:45. Nothing further was needed

## 2012-11-10 NOTE — Assessment & Plan Note (Addendum)
Resolving COPD flare and sinusitis w/ bilateral cerumen impaction  Ear irrigation w/ wax extraction-tolerated well.   Plan  Finish Augmentin Mucinex DM Twice daily  As needed  Cough/congesiton  Fluids and rest  Saline nasal rinses As needed   Debrox As needed  For ear wax  Please contact office for sooner follow up if symptoms do not improve or worsen or seek emergency care  follow up Dr. Maple Hudson  In 6 weeks

## 2012-11-10 NOTE — Progress Notes (Signed)
   Patient ID: Cesar Walker, male    DOB: 01-24-1928, 79 y.o.   MRN: [id number]  HPI 79 yo male with known hx of COPD, Asthma, hx of lung cancer s/p resection  11/01/2012 Acute OV  Complains of sinus congestion w/ yellow drainage, PND w/ prod cough, wheezing, some tightness and SOB, low grade temp x2 weeks.  Was seen in urgent care on December 27 and given doxycycline and pred taper and depo. Feels he is  improved with decreased cough and congestion in his chest. However, he continues to have sinus pain, and pressure, and blows out thick, yellow, bloody discharge. At times. Wheezing, and shortness of breath have diminished He denies any hemoptysis, orthopnea, PND, or leg swelling He has not been seen in the office in greater than 9 months and have advised him on keeping his followup appointments. CXR w/ NAD at [facility] visit.  >>Augmentin x 10 d   11/08/12 Acute Ov  Complains of ears are stopped up, Cant hear out of ears.  Seen 1 week w/ COPD flare and sinusitis , tx w/ augmentin x 10 d  He is feeling better w/ decreased cough and congestion , but not gone.  No hemoptysis , chest pain or edema. No fever.  No ear drainage or known trauma. No dizziness .    Review of Systems Constitutional:   No  weight loss, night sweats,  Fevers, chills,  +fatigue, or  lassitude.  HEENT:   No headaches,  Difficulty swallowing,  Tooth/dental problems, or  Sore throat,                No sneezing, itching, ear ache,  +nasal congestion, post nasal drip,   CV:  No chest pain,  Orthopnea, PND, swelling in lower extremities, anasarca, dizziness, palpitations, syncope.   GI  No heartburn, indigestion, abdominal pain, nausea, vomiting, diarrhea, change in bowel habits, loss of appetite, bloody stools.   Resp:   No coughing up of blood.     No chest wall deformity  Skin: no rash or lesions.  GU: no dysuria, change in color of urine, no urgency or frequency.  No flank pain, no hematuria   MS:  No joint pain or  swelling.  No decreased range of motion.  No back pain.  Psych:  No change in mood or affect. No depression or anxiety.  No memory loss.         Objective:   Physical Exam GEN: A/Ox3; pleasant , NAD,  Elderly   HEENT:  [place]/AT,  EACs-bilateral cerumen impaction  TMs-wnl, NOSE-clear drainage   THROAT-clear, no lesions, no postnasal drip or exudate noted.   NECK:  Supple w/ fair ROM; no JVD; normal carotid impulses w/o bruits; no thyromegaly or nodules palpated; no lymphadenopathy.  RESP  Coarse BS w/ no wheezing   no accessory muscle use, no dullness to percussion  CARD:  RRR, no m/r/g  , no peripheral edema, pulses intact, no cyanosis or clubbing.  GI:   Soft & nt; nml bowel sounds; no organomegaly or masses detected.  Musco: Warm bil, no deformities or joint swelling noted.   Neuro: alert, no focal deficits noted.    Skin: Warm, no lesions or rashes

## 2012-12-13 ENCOUNTER — Ambulatory Visit (INDEPENDENT_AMBULATORY_CARE_PROVIDER_SITE_OTHER): Payer: Medicare Other | Admitting: Internal Medicine

## 2012-12-13 ENCOUNTER — Encounter: Payer: Self-pay | Admitting: Internal Medicine

## 2012-12-13 VITALS — BP 112/70 | HR 69 | Ht 62.0 in | Wt 160.0 lb

## 2012-12-13 DIAGNOSIS — C3490 Malignant neoplasm of unspecified part of unspecified bronchus or lung: Secondary | ICD-10-CM

## 2012-12-13 DIAGNOSIS — Z23 Encounter for immunization: Secondary | ICD-10-CM

## 2012-12-13 DIAGNOSIS — J438 Other emphysema: Secondary | ICD-10-CM

## 2012-12-13 DIAGNOSIS — J439 Emphysema, unspecified: Secondary | ICD-10-CM

## 2012-12-13 NOTE — Patient Instructions (Addendum)
Pneumovax  Ok to continue present meds

## 2012-12-13 NOTE — Progress Notes (Signed)
Patient ID: Cesar Walker, male    DOB: Feb 12, 1928, 79 y.o.   MRN: [id number]  HPI 79 yo male with known hx of COPD, Asthma, hx of lung cancer s/p resection  11/01/2012 Acute OV  Complains of sinus congestion w/ yellow drainage, PND w/ prod cough, wheezing, some tightness and SOB, low grade temp x2 weeks.  Was seen in urgent care on December 27 and given doxycycline and pred taper and depo. Feels he is  improved with decreased cough and congestion in his chest. However, he continues to have sinus pain, and pressure, and blows out thick, yellow, bloody discharge. At times. Wheezing, and shortness of breath have diminished He denies any hemoptysis, orthopnea, PND, or leg swelling He has not been seen in the office in greater than 9 months and have advised him on keeping his followup appointments. CXR w/ NAD at [facility] visit.  >>Augmentin x 10 d   11/08/12 Acute Ov  Complains of ears are stopped up, Cant hear out of ears.  Seen 1 week w/ COPD flare and sinusitis , tx w/ augmentin x 10 d  He is feeling better w/ decreased cough and congestion , but not gone.  No hemoptysis , chest pain or edema. No fever.  No ear drainage or known trauma. No dizziness .   12/13/12- 28 yoM former smoker followed for COPD, asthma, bronchitis, hx lung cancer/ bilateral lobectomies  FOLLOWS FOR: breathing doing good per patient; cough few days ago causing pt to produce brown sputum. Resolved with over-the-counter meds. Today back to baseline. Has had pneumonia vaccine since age 53. Has home oxygen but doesn't use it. We don't remember, but think he failed to Spiriva. CXR 10/19/12 IMPRESSION:  Stable postoperative changes. No acute findings.  Original Report Authenticated By: Myles Rosenthal, M.D.   ROS-see HPI Constitutional:   No-   weight loss, night sweats, fevers, chills, fatigue, lassitude. HEENT:   No-  headaches, difficulty swallowing, tooth/dental problems, sore throat,       No-  sneezing, itching, ear ache,  nasal congestion, post nasal drip,  CV:  No-   chest pain, orthopnea, PND, swelling in lower extremities, anasarca,                                  dizziness, palpitations Resp: + shortness of breath with exertion or at rest.              N+ productive cough,  No non-productive cough,  No- coughing up of blood.              No-   change in color of mucus.  No- wheezing.   Skin: No-   rash or lesions. GI:  No-   heartburn, indigestion, abdominal pain, nausea, vomiting,  GU: . MS:  No-   joint pain or swelling.   Neuro-     nothing unusual Psych:  No- change in mood or affect. No depression or anxiety.  No memory loss.  Objective:  OBJ- Physical Exam General- Alert, Oriented, Affect-appropriate, Distress- none acute Skin- rash-none, lesions- none, excoriation- none Lymphadenopathy- none Head- atraumatic            Eyes- Gross vision intact, PERRLA, conjunctivae and secretions clear            Ears- Hearing, canals-normal            Nose- Clear, no-Septal dev, mucus, polyps, erosion, perforation  Throat- Mallampati II , mucosa clear , drainage- none, tonsils- atrophic Neck- flexible , trachea midline, no stridor , thyroid nl, carotid no bruit Chest - symmetrical excursion , unlabored           Heart/CV- RRR , no murmur , no gallop  , no rub, nl s1 s2                           - JVD- none , edema- none, stasis changes- none, varices- none           Lung- +unlabored inspiratory wheeze, cough- none , dullness-none, rub- none           Chest wall-  Abd-  Br/ Gen/ Rectal- Not done, not indicated Extrem- cyanosis- none, clubbing, none, atrophy- none, strength- nl Neuro- grossly intact to observation

## 2012-12-16 ENCOUNTER — Encounter: Payer: Self-pay | Admitting: Internal Medicine

## 2012-12-16 ENCOUNTER — Encounter: Payer: Self-pay | Admitting: Cardiology

## 2012-12-16 NOTE — Assessment & Plan Note (Addendum)
Now back to baseline after recent acute bronchitis exacerbation. We decided to be sure of artifacts by giving him a current pneumonia vaccine

## 2013-02-17 ENCOUNTER — Emergency Department (INDEPENDENT_AMBULATORY_CARE_PROVIDER_SITE_OTHER)
Admission: EM | Admit: 2013-02-17 | Discharge: 2013-02-17 | Disposition: A | Discharge status: Home / Self Care | Payer: Medicare Other | Source: Home / Self Care

## 2013-02-17 ENCOUNTER — Encounter (HOSPITAL_COMMUNITY): Payer: Self-pay

## 2013-02-17 DIAGNOSIS — S86912A Strain of unspecified muscle(s) and tendon(s) at lower leg level, left leg, initial encounter: Secondary | ICD-10-CM

## 2013-02-17 DIAGNOSIS — E119 Type 2 diabetes mellitus without complications: Secondary | ICD-10-CM

## 2013-02-17 DIAGNOSIS — IMO0002 Reserved for concepts with insufficient information to code with codable children: Secondary | ICD-10-CM

## 2013-02-17 LAB — POCT I-STAT, CHEM 8
BUN: 14 mg/dL (ref 6–23)
Calcium, Ion: 1.22 mmol/L (ref 1.13–1.30)
Chloride: 104 mEq/L (ref 96–112)
Creatinine, Ser: 0.9 mg/dL (ref 0.50–1.35)
Glucose, Bld: 160 mg/dL — ABNORMAL HIGH (ref 70–99)
HCT: 50 % (ref 39.0–52.0)
Potassium: 3.3 mEq/L — ABNORMAL LOW (ref 3.5–5.1)

## 2013-02-17 NOTE — ED Notes (Signed)
Pt advised his glucose was 160; he is to call his MD in the AM to inform

## 2013-02-17 NOTE — ED Notes (Signed)
C/o pain in left leg behind knee for past 3 days; denies injury. States he has also been having dizzy spells for past week or so : NAD

## 2013-02-17 NOTE — ED Provider Notes (Signed)
History     CSN: [id number]  Arrival date & time 02/17/13  1608   None     Chief Complaint  Patient presents with  . Leg Pain    (Consider location/radiation/quality/duration/timing/severity/associated sxs/prior treatment) Patient is a 79 y.o. male presenting with leg pain. The history is provided by the patient.  Leg Pain Location:  Knee Time since incident:  3 days Injury: no   Knee location:  L knee Pain details:    Quality:  Burning   Radiates to:  Does not radiate Chronicity:  New Dislocation: no   Foreign body present:  No foreign bodies Prior injury to area:  No   Past Medical History  Diagnosis Date  . GERD (gastroesophageal reflux disease)   . Hypercholesteremia   . HTN (hypertension), benign     s/p echo in 2013 showing EF of 60 to65% with mild LVH and mild LAE  . Asthma   . COPD (chronic obstructive pulmonary disease)   . Anxiety   . Normal nuclear stress test 2009    EF of 72% with no ischemia or infarction  . Carcinoma of lung     S/P resection    Past Surgical History  Procedure Laterality Date  . Left lower lobectomy for cancer  1997  . Rull lung nodule removed for cancer  2002  . US echocardiography  11/2007    EF 55-60% WITH MILD LVH, IMPAIRED DIASTOLIC RELAXATION  . US echocardiography  01/18/2005    EF 55-60%  . Cardiovascular stress test  06/23/2005    EF 70%, NO EVIDENCE OF ISCHEMIA    Family History  Problem Relation Age of Onset  . Heart disease Father   . Cancer Mother     bladder  . Heart disease Maternal Grandfather     History  Substance Use Topics  . Smoking status: Former Smoker -- 2.00 packs/day for 20 years    Types: Cigarettes    Quit date: 10/23/1978  . Smokeless tobacco: Not on file  . Alcohol Use: No      Review of Systems  Constitutional: Negative.   Gastrointestinal: Negative.   Musculoskeletal: Negative for joint swelling and gait problem.  Skin: Negative.   Neurological: Positive for dizziness.  Negative for headaches.    Allergies  Review of patient's allergies indicates no known allergies.  Home Medications   Current Outpatient Rx  Name  Route  Sig  Dispense  Refill  . albuterol (PROVENTIL HFA;VENTOLIN HFA) 108 (90 BASE) MCG/ACT inhaler   Inhalation   Inhale 2 puffs into the lungs 3 (three) times daily.          Marland Kitchen albuterol (PROVENTIL) (2.5 MG/3ML) 0.083% nebulizer solution   Nebulization   Take 2.5 mg by nebulization every 4 (four) hours as needed.           Marland Kitchen amLODipine (NORVASC) 10 MG tablet   Oral   Take 10 mg by mouth daily.           Marland Kitchen aspirin 325 MG tablet   Oral   Take 325 mg by mouth daily.           BP 144/64  Pulse 75  Temp(Src) 98.3 F (36.8 C) (Oral)  Resp 20  SpO2 96%  Physical Exam  Nursing note and vitals reviewed. Constitutional: He is oriented to person, place, and time. He appears well-developed and well-nourished.  Cardiovascular: Regular rhythm and normal heart sounds.   Musculoskeletal: Normal range of motion. He exhibits no  edema and no tenderness.       Left knee: He exhibits normal range of motion, no swelling, no effusion, no deformity, no erythema and no bony tenderness. No tenderness found. No medial joint line and no lateral joint line tenderness noted.       Legs: Neurological: He is alert and oriented to person, place, and time.  Skin: Skin is warm and dry.    ED Course  Procedures (including critical care time)  Labs Reviewed  POCT I-STAT, CHEM 8 - Abnormal; Notable for the following:    Potassium 3.3 (*)    Glucose, Bld 160 (*)    All other components within normal limits   No results found.   1. Muscle strain, lower leg, left, initial encounter   2. DM (diabetes mellitus)       MDM  i stat glu 160.        Linna Hoff, MD 02/17/13 [phone]

## 2013-03-07 ENCOUNTER — Encounter: Payer: Self-pay | Admitting: Cardiology

## 2013-03-10 ENCOUNTER — Encounter: Payer: Self-pay | Admitting: Cardiology

## 2013-08-23 DEATH — deceased

## 2013-11-05 ENCOUNTER — Telehealth: Payer: Self-pay

## 2013-11-05 NOTE — Telephone Encounter (Signed)
Patient past away @ [facility] in [place] per [place]
